# Patient Record
Sex: Female | Born: 1950 | Race: White | Hispanic: No | Marital: Married | Smoking: Never smoker
Health system: Southern US, Community
[De-identification: ages and names within clinical notes are randomized; demographics above are authoritative.]

## PROBLEM LIST (undated history)

## (undated) DIAGNOSIS — K219 Gastro-esophageal reflux disease without esophagitis: Secondary | ICD-10-CM

## (undated) DIAGNOSIS — D126 Benign neoplasm of colon, unspecified: Secondary | ICD-10-CM

## (undated) DIAGNOSIS — K5790 Diverticulosis of intestine, part unspecified, without perforation or abscess without bleeding: Secondary | ICD-10-CM

## (undated) DIAGNOSIS — K648 Other hemorrhoids: Secondary | ICD-10-CM

## (undated) DIAGNOSIS — M1990 Unspecified osteoarthritis, unspecified site: Secondary | ICD-10-CM

## (undated) DIAGNOSIS — I10 Essential (primary) hypertension: Secondary | ICD-10-CM

## (undated) DIAGNOSIS — K635 Polyp of colon: Secondary | ICD-10-CM

## (undated) DIAGNOSIS — E785 Hyperlipidemia, unspecified: Secondary | ICD-10-CM

## (undated) DIAGNOSIS — E049 Nontoxic goiter, unspecified: Secondary | ICD-10-CM

## (undated) HISTORY — PX: TUBAL LIGATION: SHX77

## (undated) HISTORY — PX: KNEE ARTHROSCOPY: SUR90

## (undated) HISTORY — PX: COLONOSCOPY: SHX174

## (undated) HISTORY — PX: FOOT SURGERY: SHX648

## (undated) HISTORY — PX: CHOLECYSTECTOMY: SHX55

## (undated) HISTORY — PX: APPENDECTOMY: SHX54

---

## 1999-06-30 ENCOUNTER — Other Ambulatory Visit: Admission: RE | Admit: 1999-06-30 | Discharge: 1999-06-30 | Payer: Self-pay | Admitting: Family Medicine

## 1999-12-12 ENCOUNTER — Ambulatory Visit (HOSPITAL_COMMUNITY): Admission: RE | Admit: 1999-12-12 | Discharge: 1999-12-12 | Payer: Self-pay | Admitting: Orthopedic Surgery

## 2000-07-04 ENCOUNTER — Other Ambulatory Visit: Admission: RE | Admit: 2000-07-04 | Discharge: 2000-07-04 | Payer: Self-pay | Admitting: Family Medicine

## 2004-11-26 ENCOUNTER — Emergency Department: Payer: Self-pay | Admitting: Emergency Medicine

## 2008-03-15 ENCOUNTER — Ambulatory Visit: Payer: Self-pay | Admitting: Gastroenterology

## 2009-05-28 ENCOUNTER — Ambulatory Visit: Payer: Self-pay

## 2009-07-15 ENCOUNTER — Ambulatory Visit: Payer: Self-pay | Admitting: Orthopedic Surgery

## 2009-07-21 ENCOUNTER — Ambulatory Visit: Payer: Self-pay | Admitting: Orthopedic Surgery

## 2010-02-10 ENCOUNTER — Ambulatory Visit: Payer: Self-pay | Admitting: Family Medicine

## 2011-05-11 ENCOUNTER — Ambulatory Visit: Payer: Self-pay | Admitting: Gastroenterology

## 2014-06-28 ENCOUNTER — Ambulatory Visit
Admit: 2014-06-28 | Disposition: A | Discharge status: Home / Self Care | Payer: Self-pay | Attending: Gastroenterology | Admitting: Gastroenterology

## 2014-07-05 LAB — SURGICAL PATHOLOGY

## 2014-09-15 ENCOUNTER — Encounter
Admission: RE | Admit: 2014-09-15 | Discharge: 2014-09-15 | Disposition: A | Discharge status: Home / Self Care | Payer: 59 | Source: Ambulatory Visit | Attending: Unknown Physician Specialty | Admitting: Unknown Physician Specialty

## 2014-09-15 DIAGNOSIS — Z01810 Encounter for preprocedural cardiovascular examination: Secondary | ICD-10-CM | POA: Diagnosis present

## 2014-09-15 DIAGNOSIS — Z01812 Encounter for preprocedural laboratory examination: Secondary | ICD-10-CM | POA: Insufficient documentation

## 2014-09-15 HISTORY — DX: Diverticulosis of intestine, part unspecified, without perforation or abscess without bleeding: K57.90

## 2014-09-15 HISTORY — DX: Unspecified osteoarthritis, unspecified site: M19.90

## 2014-09-15 HISTORY — DX: Hyperlipidemia, unspecified: E78.5

## 2014-09-15 LAB — URINALYSIS COMPLETE WITH MICROSCOPIC (ARMC ONLY)
Bacteria, UA: NONE SEEN
Bilirubin Urine: NEGATIVE
Glucose, UA: NEGATIVE mg/dL
Ketones, ur: NEGATIVE mg/dL
Nitrite: NEGATIVE
PROTEIN: NEGATIVE mg/dL
Specific Gravity, Urine: 1.025 (ref 1.005–1.030)
pH: 5 (ref 5.0–8.0)

## 2014-09-15 LAB — BASIC METABOLIC PANEL
Anion gap: 8 (ref 5–15)
BUN: 18 mg/dL (ref 6–20)
CALCIUM: 8.9 mg/dL (ref 8.9–10.3)
CO2: 28 mmol/L (ref 22–32)
CREATININE: 0.84 mg/dL (ref 0.44–1.00)
Chloride: 101 mmol/L (ref 101–111)
GFR calc Af Amer: >60 mL/min (ref >60)
Glucose, Bld: 102 mg/dL — ABNORMAL HIGH (ref 65–99)
POTASSIUM: 4.1 mmol/L (ref 3.5–5.1)
SODIUM: 137 mmol/L (ref 135–145)

## 2014-09-15 LAB — CBC
HEMATOCRIT: 43.7 % (ref 35.0–47.0)
HEMOGLOBIN: 14.5 g/dL (ref 12.0–16.0)
MCH: 28.8 pg (ref 26.0–34.0)
MCHC: 33.1 g/dL (ref 32.0–36.0)
MCV: 86.9 fL (ref 80.0–100.0)
Platelets: 221 10*3/uL (ref 150–440)
RBC: 5.03 MIL/uL (ref 3.80–5.20)
RDW: 13.8 % (ref 11.5–14.5)
WBC: 9 10*3/uL (ref 3.6–11.0)

## 2014-09-15 LAB — PROTIME-INR
INR: 0.97
PROTHROMBIN TIME: 13.1 s (ref 11.4–15.0)

## 2014-09-15 LAB — SURGICAL PCR SCREEN
MRSA, PCR: NEGATIVE
Staphylococcus aureus: POSITIVE — AB

## 2014-09-15 LAB — APTT: aPTT: 31 seconds (ref 24–36)

## 2014-09-15 LAB — ABO/RH: ABO/RH(D): A POS

## 2014-09-15 NOTE — Patient Instructions (Signed)
  Your procedure is scheduled on: September 29, 2014 (Wednesday) Report to Day Surgery. To find out your arrival time please call [phone] between 1PM - 3PM on September 28, 2014 (Tuesday).  Remember: Instructions that are not followed completely may result in serious medical risk, up to and including death, or upon the discretion of your surgeon and anesthesiologist your surgery may need to be rescheduled.    __x__ 1. Do not eat food or drink liquids after midnight. No gum chewing or hard candies.     ____ 2. No Alcohol for 24 hours before or after surgery.   ____ 3. Bring all medications with you on the day of surgery if instructed.    __x__ 4. Notify your doctor if there is any change in your medical condition     (cold, fever, infections).     Do not wear jewelry, make-up, hairpins, clips or nail polish.  Do not wear lotions, powders, or perfumes. You may wear deodorant.  Do not shave 48 hours prior to surgery. Men may shave face and neck.  Do not bring valuables to the hospital.    [facility] is not responsible for any belongings or valuables.               Contacts, dentures or bridgework may not be worn into surgery.  Leave your suitcase in the car. After surgery it may be brought to your room.  For patients admitted to the hospital, discharge time is determined by your                treatment team.   Patients discharged the day of surgery will not be allowed to drive home.   Please read over the following fact sheets that you were given:   MRSA Information and Surgical Site Infection Prevention   ____ Take these medicines the morning of surgery with A SIP OF WATER:    1.   2.   3.   4.  5.  6.  ____ Fleet Enema (as directed)   _x_ Use CHG Soap as directed  ____ Use inhalers on the day of surgery  ____ Stop metformin 2 days prior to surgery    ____ Take 1/2 of usual insulin dose the night before surgery and none on the morning of surgery.   ____ Stop  Coumadin/Plavix/aspirin on   __x__ Stop Anti-inflammatories on (STOP MELOXICAM ONE WEEK BEFORE SURGERY)  __x__ Stop supplements until after surgery.  (STOP GLUCOSAMINE NOW)  ____ Bring C-Pap to the hospital.

## 2014-09-29 ENCOUNTER — Inpatient Hospital Stay: Payer: 59

## 2014-09-29 ENCOUNTER — Inpatient Hospital Stay: Payer: 59 | Admitting: Anesthesiology

## 2014-09-29 ENCOUNTER — Encounter
Admission: RE | Disposition: A | Discharge status: Skilled Nursing Facility | Payer: Self-pay | Source: Ambulatory Visit | Attending: Unknown Physician Specialty

## 2014-09-29 ENCOUNTER — Other Ambulatory Visit: Payer: Self-pay | Admitting: Unknown Physician Specialty

## 2014-09-29 ENCOUNTER — Encounter: Payer: Self-pay | Admitting: *Deleted

## 2014-09-29 ENCOUNTER — Inpatient Hospital Stay
Admission: RE | Admit: 2014-09-29 | Discharge: 2014-10-02 | DRG: 470 | Disposition: A | Discharge status: Skilled Nursing Facility | Payer: 59 | Source: Ambulatory Visit | Attending: Unknown Physician Specialty | Admitting: Unknown Physician Specialty

## 2014-09-29 DIAGNOSIS — Z9889 Other specified postprocedural states: Secondary | ICD-10-CM

## 2014-09-29 DIAGNOSIS — E785 Hyperlipidemia, unspecified: Secondary | ICD-10-CM | POA: Diagnosis present

## 2014-09-29 DIAGNOSIS — K5790 Diverticulosis of intestine, part unspecified, without perforation or abscess without bleeding: Secondary | ICD-10-CM | POA: Diagnosis present

## 2014-09-29 DIAGNOSIS — M179 Osteoarthritis of knee, unspecified: Secondary | ICD-10-CM | POA: Diagnosis present

## 2014-09-29 DIAGNOSIS — Z6841 Body Mass Index (BMI) 40.0 and over, adult: Secondary | ICD-10-CM | POA: Diagnosis not present

## 2014-09-29 DIAGNOSIS — Z96659 Presence of unspecified artificial knee joint: Secondary | ICD-10-CM

## 2014-09-29 HISTORY — PX: TOTAL KNEE ARTHROPLASTY: SHX125

## 2014-09-29 LAB — CBC
HCT: 41.8 % (ref 35.0–47.0)
HEMOGLOBIN: 13.8 g/dL (ref 12.0–16.0)
MCH: 28.8 pg (ref 26.0–34.0)
MCHC: 33.1 g/dL (ref 32.0–36.0)
MCV: 87.2 fL (ref 80.0–100.0)
Platelets: 216 10*3/uL (ref 150–440)
RBC: 4.79 MIL/uL (ref 3.80–5.20)
RDW: 13.8 % (ref 11.5–14.5)
WBC: 10.1 10*3/uL (ref 3.6–11.0)

## 2014-09-29 LAB — CREATININE, SERUM
Creatinine, Ser: 0.87 mg/dL (ref 0.44–1.00)
GFR calc Af Amer: >60 mL/min (ref >60)
GFR calc non Af Amer: >60 mL/min (ref >60)

## 2014-09-29 LAB — PREPARE RBC (CROSSMATCH)

## 2014-09-29 SURGERY — ARTHROPLASTY, KNEE, TOTAL
Anesthesia: Regional | Laterality: Left

## 2014-09-29 MED ORDER — CEFAZOLIN SODIUM-DEXTROSE 2-3 GM-% IV SOLR
2.0000 g | Freq: Once | INTRAVENOUS | Status: AC
  Administered 2014-09-29: 1 g via INTRAVENOUS
  Administered 2014-09-29: 2 g via INTRAVENOUS

## 2014-09-29 MED ORDER — DIPHENHYDRAMINE HCL 50 MG/ML IJ SOLN
INTRAMUSCULAR | Status: AC
  Administered 2014-09-29: 25 mg via INTRAVENOUS
  Filled 2014-09-29: qty 1

## 2014-09-29 MED ORDER — ACETAMINOPHEN 10 MG/ML IV SOLN
INTRAVENOUS | Status: AC
  Filled 2014-09-29: qty 100

## 2014-09-29 MED ORDER — CEFAZOLIN SODIUM-DEXTROSE 2-3 GM-% IV SOLR
INTRAVENOUS | Status: AC
  Filled 2014-09-29: qty 50

## 2014-09-29 MED ORDER — ONDANSETRON HCL 4 MG/2ML IJ SOLN
4.0000 mg | Freq: Four times a day (QID) | INTRAMUSCULAR | Status: DC | PRN
  Administered 2014-09-30: 4 mg via INTRAVENOUS
  Filled 2014-09-29: qty 2

## 2014-09-29 MED ORDER — DIPHENHYDRAMINE HCL 50 MG/ML IJ SOLN
25.0000 mg | Freq: Once | INTRAMUSCULAR | Status: AC
  Administered 2014-09-29: 25 mg via INTRAVENOUS

## 2014-09-29 MED ORDER — BUPIVACAINE-EPINEPHRINE (PF) 0.5% -1:200000 IJ SOLN
INTRAMUSCULAR | Status: AC
  Filled 2014-09-29: qty 30

## 2014-09-29 MED ORDER — LIDOCAINE HCL (CARDIAC) 20 MG/ML IV SOLN
INTRAVENOUS | Status: DC | PRN
  Administered 2014-09-29: 80 mg via INTRAVENOUS

## 2014-09-29 MED ORDER — NEOMYCIN-POLYMYXIN B GU 40-200000 IR SOLN
Status: AC
  Filled 2014-09-29: qty 20

## 2014-09-29 MED ORDER — PRAVASTATIN SODIUM 20 MG PO TABS
20.0000 mg | ORAL_TABLET | Freq: Every day | ORAL | Status: DC
  Administered 2014-09-30 – 2014-10-01 (×2): 20 mg via ORAL
  Filled 2014-09-29 (×2): qty 1

## 2014-09-29 MED ORDER — BUPIVACAINE HCL (PF) 0.5 % IJ SOLN
INTRAMUSCULAR | Status: DC | PRN
  Administered 2014-09-29: 15 mg

## 2014-09-29 MED ORDER — EPHEDRINE SULFATE 50 MG/ML IJ SOLN
INTRAMUSCULAR | Status: DC | PRN
  Administered 2014-09-29 (×2): 10 mg via INTRAVENOUS

## 2014-09-29 MED ORDER — BUPIVACAINE LIPOSOME 1.3 % IJ SUSP
INTRAMUSCULAR | Status: AC
  Filled 2014-09-29: qty 20

## 2014-09-29 MED ORDER — PHENOL 1.4 % MT LIQD: 1.0000 | OROMUCOSAL | Status: DC | PRN

## 2014-09-29 MED ORDER — DEXAMETHASONE SODIUM PHOSPHATE 10 MG/ML IJ SOLN
INTRAMUSCULAR | Status: AC
  Administered 2014-09-29: 10 mg via INTRAVENOUS
  Filled 2014-09-29: qty 1

## 2014-09-29 MED ORDER — CEFAZOLIN SODIUM 1-5 GM-% IV SOLN
1.0000 g | Freq: Three times a day (TID) | INTRAVENOUS | Status: AC
  Administered 2014-09-29 – 2014-09-30 (×3): 1 g via INTRAVENOUS
  Filled 2014-09-29 (×3): qty 50

## 2014-09-29 MED ORDER — KCL IN DEXTROSE-NACL 20-5-0.45 MEQ/L-%-% IV SOLN
INTRAVENOUS | Status: DC
  Administered 2014-09-29 – 2014-09-30 (×2): via INTRAVENOUS
  Filled 2014-09-29 (×8): qty 1000

## 2014-09-29 MED ORDER — FENTANYL CITRATE (PF) 100 MCG/2ML IJ SOLN: 25.0000 ug | INTRAMUSCULAR | Status: DC | PRN

## 2014-09-29 MED ORDER — TRANEXAMIC ACID 1000 MG/10ML IV SOLN
INTRAVENOUS | Status: AC
  Filled 2014-09-29: qty 10

## 2014-09-29 MED ORDER — OXYCODONE HCL 5 MG PO TABS
5.0000 mg | ORAL_TABLET | ORAL | Status: DC | PRN
  Administered 2014-09-29 (×3): 5 mg via ORAL
  Administered 2014-09-30 (×4): 10 mg via ORAL
  Administered 2014-09-30 – 2014-10-01 (×3): 5 mg via ORAL
  Administered 2014-10-01: 10 mg via ORAL
  Filled 2014-09-29: qty 1
  Filled 2014-09-29 (×2): qty 2
  Filled 2014-09-29: qty 1
  Filled 2014-09-29 (×2): qty 2
  Filled 2014-09-29: qty 1
  Filled 2014-09-29: qty 2
  Filled 2014-09-29 (×2): qty 1

## 2014-09-29 MED ORDER — FAMOTIDINE 20 MG PO TABS
20.0000 mg | ORAL_TABLET | Freq: Once | ORAL | Status: AC
  Administered 2014-09-29: 20 mg via ORAL

## 2014-09-29 MED ORDER — DEXAMETHASONE SODIUM PHOSPHATE 10 MG/ML IJ SOLN
10.0000 mg | Freq: Once | INTRAMUSCULAR | Status: AC
  Administered 2014-09-29: 10 mg via INTRAVENOUS

## 2014-09-29 MED ORDER — SODIUM CHLORIDE 0.9 % IV SOLN
INTRAVENOUS | Status: DC | PRN
  Administered 2014-09-29: 60 mL

## 2014-09-29 MED ORDER — ACETAMINOPHEN 325 MG PO TABS: 650.0000 mg | ORAL_TABLET | Freq: Four times a day (QID) | ORAL | Status: DC | PRN

## 2014-09-29 MED ORDER — HYDROMORPHONE HCL 1 MG/ML IJ SOLN: 0.2500 mg | INTRAMUSCULAR | Status: DC | PRN

## 2014-09-29 MED ORDER — TRANEXAMIC ACID 1000 MG/10ML IV SOLN
1000.0000 mg | INTRAVENOUS | Status: DC | PRN
  Administered 2014-09-29: 1000 mg via INTRAVENOUS

## 2014-09-29 MED ORDER — LACTATED RINGERS IV SOLN
INTRAVENOUS | Status: DC
  Administered 2014-09-29 (×2): via INTRAVENOUS

## 2014-09-29 MED ORDER — ENOXAPARIN SODIUM 30 MG/0.3ML ~~LOC~~ SOLN
30.0000 mg | Freq: Two times a day (BID) | SUBCUTANEOUS | Status: DC
  Administered 2014-09-29 – 2014-10-02 (×6): 30 mg via SUBCUTANEOUS
  Filled 2014-09-29 (×6): qty 0.3

## 2014-09-29 MED ORDER — SODIUM CHLORIDE 0.9 % IJ SOLN
INTRAMUSCULAR | Status: AC
  Filled 2014-09-29: qty 50

## 2014-09-29 MED ORDER — MIDAZOLAM HCL 2 MG/2ML IJ SOLN
INTRAMUSCULAR | Status: DC | PRN
  Administered 2014-09-29: 2 mg via INTRAVENOUS

## 2014-09-29 MED ORDER — ONDANSETRON HCL 4 MG/2ML IJ SOLN: 4.0000 mg | Freq: Once | INTRAMUSCULAR | Status: DC | PRN

## 2014-09-29 MED ORDER — BUPIVACAINE-EPINEPHRINE (PF) 0.5% -1:200000 IJ SOLN
INTRAMUSCULAR | Status: DC | PRN
Start: 2014-09-29 — End: 2014-09-29
  Administered 2014-09-29: 30 mL

## 2014-09-29 MED ORDER — VANCOMYCIN HCL IN DEXTROSE 1-5 GM/200ML-% IV SOLN
1000.0000 mg | Freq: Once | INTRAVENOUS | Status: AC
  Administered 2014-09-29: 1000 mg via INTRAVENOUS

## 2014-09-29 MED ORDER — PROPOFOL INFUSION 10 MG/ML OPTIME
INTRAVENOUS | Status: DC | PRN
  Administered 2014-09-29: 75 ug/kg/min via INTRAVENOUS

## 2014-09-29 MED ORDER — VANCOMYCIN HCL IN DEXTROSE 1-5 GM/200ML-% IV SOLN
INTRAVENOUS | Status: AC
  Administered 2014-09-29: 1000 mg via INTRAVENOUS
  Filled 2014-09-29: qty 200

## 2014-09-29 MED ORDER — NEOMYCIN-POLYMYXIN B GU 40-200000 IR SOLN
Status: DC | PRN
Start: 2014-09-29 — End: 2014-09-29
  Administered 2014-09-29: 16 mL

## 2014-09-29 MED ORDER — ONDANSETRON HCL 4 MG PO TABS: 4.0000 mg | ORAL_TABLET | Freq: Four times a day (QID) | ORAL | Status: DC | PRN

## 2014-09-29 MED ORDER — HYDROMORPHONE HCL 1 MG/ML IJ SOLN
1.0000 mg | INTRAMUSCULAR | Status: DC | PRN
  Administered 2014-09-29 – 2014-10-01 (×6): 1 mg via INTRAVENOUS
  Filled 2014-09-29 (×6): qty 1

## 2014-09-29 MED ORDER — ACETAMINOPHEN 650 MG RE SUPP: 650.0000 mg | Freq: Four times a day (QID) | RECTAL | Status: DC | PRN

## 2014-09-29 MED ORDER — FAMOTIDINE 20 MG PO TABS
ORAL_TABLET | ORAL | Status: AC
  Administered 2014-09-29: 20 mg via ORAL
  Filled 2014-09-29: qty 1

## 2014-09-29 MED ORDER — POLYETHYLENE GLYCOL 3350 17 G PO PACK
17.0000 g | PACK | Freq: Every day | ORAL | Status: DC | PRN
  Administered 2014-10-01: 17 g via ORAL
  Filled 2014-09-29: qty 1

## 2014-09-29 MED ORDER — MENTHOL 3 MG MT LOZG: 1.0000 | LOZENGE | OROMUCOSAL | Status: DC | PRN

## 2014-09-29 MED ORDER — ACETAMINOPHEN 10 MG/ML IV SOLN
INTRAVENOUS | Status: DC | PRN
  Administered 2014-09-29: 1000 mg via INTRAVENOUS

## 2014-09-29 SURGICAL SUPPLY — 61 items
BLADE SAGITTAL AGGR TOOTH XLG (BLADE) ×3 IMPLANT
BLADE SAW 1/2 (BLADE) ×3 IMPLANT
BLADE SAW SAG 29X58X.64 (BLADE) ×3 IMPLANT
BLADE SURG 15 STRL LF DISP TIS (BLADE) ×1 IMPLANT
BLADE SURG 15 STRL SS (BLADE) ×3
BNDG COHESIVE 6X5 TAN STRL LF (GAUZE/BANDAGES/DRESSINGS) ×3 IMPLANT
BOWL CEMENT MIXING ADV NOZZLE (MISCELLANEOUS) ×3 IMPLANT
CANISTER SUCT 1200ML W/VALVE (MISCELLANEOUS) ×3 IMPLANT
CANISTER SUCT 3000ML (MISCELLANEOUS) ×3 IMPLANT
CAPT KNEE TOTAL 3 ×2 IMPLANT
CATH TRAY 16F METER LATEX (MISCELLANEOUS) ×3 IMPLANT
CEMENT BONE GENTAMICIN (Cement) ×6 IMPLANT
CEMENT BONE GENTAMICIN PWDR (Cement) ×2 IMPLANT
CHLORAPREP W/TINT 26ML (MISCELLANEOUS) ×9 IMPLANT
COOLER POLAR GLACIER W/PUMP (MISCELLANEOUS) ×3 IMPLANT
DECANTER SPIKE VIAL GLASS SM (MISCELLANEOUS) ×6 IMPLANT
DRAPE INCISE IOBAN 66X45 STRL (DRAPES) ×3 IMPLANT
DRAPE SHEET LG 3/4 BI-LAMINATE (DRAPES) ×3 IMPLANT
DRSG AQUACEL AG ADV 3.5X10 (GAUZE/BANDAGES/DRESSINGS) ×2 IMPLANT
GAUZE PETRO XEROFOAM 1X8 (MISCELLANEOUS) ×3 IMPLANT
GAUZE SPONGE 4X4 12PLY STRL (GAUZE/BANDAGES/DRESSINGS) ×3 IMPLANT
GLOVE BIO SURGEON STRL SZ8 (GLOVE) ×12 IMPLANT
GLOVE BIOGEL M STRL SZ7.5 (GLOVE) ×3 IMPLANT
GLOVE INDICATOR 8.0 STRL GRN (GLOVE) ×3 IMPLANT
GOWN STRL REUS W/ TWL LRG LVL3 (GOWN DISPOSABLE) ×2 IMPLANT
GOWN STRL REUS W/TWL LRG LVL3 (GOWN DISPOSABLE) ×6
GOWN STRL REUS W/TWL LRG LVL4 (GOWN DISPOSABLE) ×6 IMPLANT
HANDPIECE SUCTION TUBG SURGILV (MISCELLANEOUS) ×3 IMPLANT
HOOD PEEL AWAY FACE SHEILD DIS (HOOD) ×6 IMPLANT
IMMBOLIZER KNEE 19 BLUE UNIV (SOFTGOODS) ×2 IMPLANT
IRRIGATION STRYKERFLOW (MISCELLANEOUS) ×1 IMPLANT
IRRIGATOR STRYKERFLOW (MISCELLANEOUS) ×3
KIT RM TURNOVER STRD PROC AR (KITS) ×3 IMPLANT
NDL SAFETY 18GX1.5 (NEEDLE) ×3 IMPLANT
NDL SAFETY 22GX1.5 (NEEDLE) ×3 IMPLANT
NDL SPNL 18GX3.5 QUINCKE PK (NEEDLE) ×1 IMPLANT
NDL SPNL 20GX3.5 QUINCKE YW (NEEDLE) ×2 IMPLANT
NEEDLE SPNL 18GX3.5 QUINCKE PK (NEEDLE) ×3 IMPLANT
NEEDLE SPNL 20GX3.5 QUINCKE YW (NEEDLE) ×6 IMPLANT
NS IRRIG 1000ML POUR BTL (IV SOLUTION) ×3 IMPLANT
PACK TOTAL KNEE (MISCELLANEOUS) ×3 IMPLANT
PAD ABD DERMACEA PRESS 5X9 (GAUZE/BANDAGES/DRESSINGS) ×3 IMPLANT
PAD GROUND ADULT SPLIT (MISCELLANEOUS) ×3 IMPLANT
PAD WRAPON POLAR KNEE (MISCELLANEOUS) ×1 IMPLANT
SOL .9 NS 3000ML IRR  AL (IV SOLUTION) ×2
SOL .9 NS 3000ML IRR AL (IV SOLUTION) ×1
SOL .9 NS 3000ML IRR UROMATIC (IV SOLUTION) ×1 IMPLANT
SOL PREP PVP 2OZ (MISCELLANEOUS) ×3
SOLUTION PREP PVP 2OZ (MISCELLANEOUS) ×1 IMPLANT
STAPLER SKIN PROX 35W (STAPLE) ×3 IMPLANT
SUCTION FRAZIER TIP 10 FR DISP (SUCTIONS) ×3 IMPLANT
SUT ETHIBOND CT1 BRD #0 30IN (SUTURE) ×10 IMPLANT
SUT ETHIBOND NAB CT1 #1 30IN (SUTURE) ×2 IMPLANT
SUT VIC AB 2-0 CT1 27 (SUTURE) ×6
SUT VIC AB 2-0 CT1 TAPERPNT 27 (SUTURE) ×2 IMPLANT
SYR 20CC LL (SYRINGE) ×3 IMPLANT
SYR 30ML LL (SYRINGE) ×3 IMPLANT
SYR 50ML LL SCALE MARK (SYRINGE) ×3 IMPLANT
SYRINGE 10CC LL (SYRINGE) ×3 IMPLANT
WATER STERILE IRR 1000ML POUR (IV SOLUTION) ×3 IMPLANT
WRAPON POLAR PAD KNEE (MISCELLANEOUS) ×3

## 2014-09-29 NOTE — H&P (Signed)
  H and P reviewed. No changes. Uploaded at later date. 

## 2014-09-29 NOTE — Anesthesia Procedure Notes (Signed)
Spinal Patient location during procedure: OR Staffing Performed by: resident/CRNA  Preanesthetic Checklist Completed: patient identified, site marked, surgical consent, pre-op evaluation, timeout performed, IV checked, risks and benefits discussed and monitors and equipment checked Spinal Block Patient position: sitting Prep: Betadine Patient monitoring: heart rate, continuous pulse ox, blood pressure and cardiac monitor Approach: midline Location: L4-5 Injection technique: single-shot Needle Needle type: Whitacre and Introducer  Needle gauge: 25 G Needle length: 9 cm Additional Notes Negative paresthesia. Negative blood return. Positive free-flowing CSF. Expiration date of kit checked and confirmed. Patient tolerated procedure well, without complications.

## 2014-09-29 NOTE — Op Note (Signed)
DATE OF SURGERY:  09/29/2014  PATIENT NAME:  Linda Rios   DOB: [birth year]  MRN: [id number]  PRE-OPERATIVE DIAGNOSIS: Degenerative arthrosis of the left knee  POST-OPERATIVE DIAGNOSIS:  Degenerative arthrosis of the left knee  PROCEDURE:  Left total knee arthroplasty  SURGEON:  Dr.Chery Giusto Leeanne Mannan. M.D.  ASSISTANT: Reche Dixon, PA-C     ANESTHESIA: Gen.  ESTIMATED BLOOD LOSS: 50 mL  TOURNIQUET TIME: 115 minutes   IMPLANTS UTILIZED: #4 triathlon posterior stabilized femoral component, a #5 tibial baseplate, a #59 mm thick tibial bearing insert, a 10 mm thick asymmetric patella that was 32 mm in diameter  INDICATIONS FOR SURGERY: Linda Rios is a 64 y.o. year old female with a long history of progressive knee pain. X-rays demonstrated severe degenerative changes in tricompartmental fashion. The patient had not seen any significant improvement despite conservative nonsurgical intervention. After discussion of the risks and benefits of surgical intervention, the patient expressed understanding of the risks benefits and agree with plans for total knee arthroplasty.   The risks, benefits, and alternatives were discussed at length including but not limited to the risks of infection, bleeding, nerve injury, stiffness, blood clots, the need for revision surgery, cardiopulmonary complications, among others, and they were willing to proceed.  PROCEDURE:   The patient was taken to the operating room where satisfactory general anesthesia was achieved. A Foley catheter was inserted. The patient was given 2 g of Kefzol IV prior to start of the procedure. A tourniquet was applied to left upper thigh. The left lower extremity was prepped and draped in the usual fashion for a total knee procedure. The left lower extremity was then exsanguinated, and the tourniquet was inflated. A medial parapatella incision was made.. Dissection carried down through the subcutaneous tissue onto the capsule. This was  divided in line with the incision. The knee was then inspected. There was significant erosion of the lateral tibial plateau and  femoral condylar surfaces. There was moderate trochlear groove erosion and moderate retropatellar erosion. I went ahead and drilled a hole in the intercondylar notch. Intramedullary rod was inserted. On the rod was the distal femoral cutting block. It was positioned against the distal femur. It was set for  5 degree valgus cut. The cutting block was fixed to the distal femur with smooth pins and then the intramedullary road was removed. The distal femoral cut was made, removing 10 mm of bone. I then went ahead and removed some medial and lateral compartment meniscal remnants. ACL and PCL were excised. The knee was hyperflexed. External alignment jig was placed on the left leg and set for a cut 2 mm below the lateral tibial plateau. The cut was sloped 3 degrees. I went ahead and made this cut without difficulty. I removed the cut bone from the proximal tibial which gave me a flush cut across the tibial surface. I then inserted a  gap spacer with the knee extended, and indeed it was too tight so I removed 2 more millimeters of bone from the proximal tibial surface. This allowed the trial spacer to be inserted without difficulty. The knee was hyperflexed. The femoral sizing component was placed on the distal femur. It was lined up with the epicondylar axis. It was felt that a #4 femoral component was going to be the appropriate size. Holes were made for this component through the sizing guide, and then the sizing guide was removed. The four-in-one cutting block was impacted onto the distal femur. Anterior and posterior cuts  were made along with the anterior and posterior chamfer cuts. I then notched out the intercondylar area with the appropriate jig. I then sized the proximal tibia for a #5 base plate. With the #4 femoral component in place and the #5 base plate in place, I was able to  insert a 9 mm trial tibial-bearing insert. With all the trials in place, the knee fully extended and flexed well.. I then  resected 8 mm of bone from retropatellar surface. I made peg holes for the A 32 mm patellar implant. Trial patella was inserted. It tracked well.   The trials were removed. The tibial base plate was positioned appropriately for the proximal tibial cruciate cut. The proximal tibial cruciate cut ws made, and then a small reamer was used to ream into the proximal tibia. Next, I went ahead and drilled 2 holes through the trial  component for the pegs on the permanent femoral component.    I then sequentially impacted the components. The #5  tibial base place was impacted on the proximal tibia with antibiotic-impregnated methyl methacrylate. Excess glue ws removed. I then impacted the #4 femoral component onto the distal femur with methyl methacrylate. Again, excess glue was removed. I then inserted a trial 9 mm spacer. The knee was extended.  I went ahead and applied a 10 mm thick asymmetric patella to the retropatellar surface with methyl methacrylate. Once again, excess glue was removed. After the glue had set up I went ahead and removed the  trial tibial spacer and inserted a permanent 9 mm tibial spacer.  The knee came to full extension and flexed well. The knee seemed to be reasonably stable.   Tourniquet was released at this time. Tourniquet was up for about 115 minutes.  Bleeding was controlled with coagulation cautery. The wound was irrigated with GU irrigant.  I also injected the capsule and subcutaneous tissue with about 50 cc of Exparel and saline mixture and 50 cc of .25% Marcaine. I went ahead and closed the capsule with #0 ethibond sutures and then injected 10 cc of TXA through the capsule into the joint. The subcutaneous tissue was closed with  2-0 Vicryl, and skin with skin staples. Subsequent to the closure, I did go ahead and apply a sterile dressing and 4 TENS pads. Polar  Care cooling pad was applied.  The patient was then transferred to a stretcher bed and taken to the recovery room in satisfactory condition. Blood loss was about 50 cc.        Dr.Rik Wadel Leeanne Mannan. M.D.

## 2014-09-29 NOTE — OR Nursing (Signed)
Patient with c/o itching to head and scalp with hives and redness noted to forehead; pt in nad; IV vancomycin was discontinued at this time.  orders received from dr Andree Elk; administered per protocol. Reaction noted about 30 minutes after vancomycin was started.  Patient reports relief several minutes after the vancomycin was removed. resp even and unl; redness continues at base of scalp.   Patient was taken into OR with dr Andree Elk and CRNA at bedside;

## 2014-09-29 NOTE — Anesthesia Preprocedure Evaluation (Signed)
Anesthesia Evaluation  Patient identified by MRN, date of birth, ID band Patient awake    Reviewed: Allergy & Precautions, H&P , NPO status , Patient's Chart, lab work & pertinent test results, reviewed documented beta blocker date and time   Airway Mallampati: II  TM Distance: >3 FB Neck ROM: full    Dental no notable dental hx. (+) Teeth Intact   Pulmonary neg pulmonary ROS,  breath sounds clear to auscultation  Pulmonary exam normal       Cardiovascular Exercise Tolerance: Good negative cardio ROS Normal cardiovascular examRhythm:regular Rate:Normal     Neuro/Psych negative neurological ROS  negative psych ROS   GI/Hepatic negative GI ROS, Neg liver ROS,   Endo/Other  negative endocrine ROSMorbid obesity  Renal/GU negative Renal ROS  negative genitourinary   Musculoskeletal   Abdominal   Peds  Hematology negative hematology ROS (+)   Anesthesia Other Findings   Reproductive/Obstetrics                             Anesthesia Physical Anesthesia Plan  ASA: II  Anesthesia Plan: Regional and Epidural   Post-op Pain Management:    Induction:   Airway Management Planned:   Additional Equipment:   Intra-op Plan:   Post-operative Plan:   Informed Consent: I have reviewed the patients History and Physical, chart, labs and discussed the procedure including the risks, benefits and alternatives for the proposed anesthesia with the patient or authorized representative who has indicated his/her understanding and acceptance.     Plan Discussed with: CRNA  Anesthesia Plan Comments:         Anesthesia Quick Evaluation

## 2014-09-29 NOTE — Transfer of Care (Signed)
Immediate Anesthesia Transfer of Care Note  Patient: Linda Rios  Procedure(s) Performed: Procedure(s): TOTAL KNEE ARTHROPLASTY (Left)  Patient Location: PACU  Anesthesia Type:Spinal  Level of Consciousness: awake and patient cooperative  Airway & Oxygen Therapy: Patient Spontanous Breathing and Patient connected to face mask oxygen  Post-op Assessment: Report given to RN and Post -op Vital signs reviewed and stable  Post vital signs: Reviewed and stable  Last Vitals:  Filed Vitals:   09/29/14 1122  BP: 100/57  Pulse: 66  Temp: 36.4 C  Resp: 20    Complications: No apparent anesthesia complications

## 2014-09-30 ENCOUNTER — Encounter
Admission: RE | Admit: 2014-09-30 | Discharge: 2014-09-30 | Disposition: A | Discharge status: Home / Self Care | Payer: 59 | Source: Ambulatory Visit | Attending: Internal Medicine | Admitting: Internal Medicine

## 2014-09-30 ENCOUNTER — Encounter: Payer: Self-pay | Admitting: Unknown Physician Specialty

## 2014-09-30 LAB — BASIC METABOLIC PANEL
ANION GAP: 7 (ref 5–15)
BUN: 15 mg/dL (ref 6–20)
CALCIUM: 8.5 mg/dL — AB (ref 8.9–10.3)
CHLORIDE: 97 mmol/L — AB (ref 101–111)
CO2: 30 mmol/L (ref 22–32)
CREATININE: 0.95 mg/dL (ref 0.44–1.00)
Glucose, Bld: 123 mg/dL — ABNORMAL HIGH (ref 65–99)
Potassium: 4.3 mmol/L (ref 3.5–5.1)
Sodium: 134 mmol/L — ABNORMAL LOW (ref 135–145)

## 2014-09-30 MED ORDER — TRAMADOL HCL 50 MG PO TABS
50.0000 mg | ORAL_TABLET | Freq: Four times a day (QID) | ORAL | Status: DC | PRN
  Administered 2014-09-30 (×2): 50 mg via ORAL
  Administered 2014-10-01: 100 mg via ORAL
  Administered 2014-10-02: 50 mg via ORAL
  Filled 2014-09-30: qty 1
  Filled 2014-09-30: qty 2
  Filled 2014-09-30 (×2): qty 1

## 2014-09-30 NOTE — Evaluation (Signed)
Physical Therapy Evaluation Patient Details Name: Linda Rios MRN: [id number] DOB: 05-18-1950 Today's Date: 09/30/2014   History of Present Illness  This patient is a 64 year old female who came to [facility] for a left TKR  Clinical Impression  Pt presents with history of arthritis, hyperlipidemia, and diverticulosis. Examination reveals that pt ambulates with min assist +2, transfers with max assist +2, and performs bed mobility at moderate assist for LLE management. She was in a great deal of pain with mobility, but was willing to work with therapy in order to get to her recliner. Primary physical deficits include decreased strength, ROM, and decreased activity tolerance secondary to pain. Pt stated that the TENS unit on LLE made it feel somewhat better. She will continue to benefit from skilled PT in order to address above deficits for safe return to home environment.     Follow Up Recommendations Home health PT    Equipment Recommendations  None recommended by PT    Recommendations for Other Services       Precautions / Restrictions Precautions Precautions: Fall Required Braces or Orthoses: Knee Immobilizer - Left (with mobility ) Knee Immobilizer - Left: On when out of bed or walking Restrictions Weight Bearing Restrictions: Yes (WBAT)      Mobility  Bed Mobility Overal bed mobility: Needs Assistance Bed Mobility: Supine to Sit     Supine to sit: Mod assist     General bed mobility comments: Pt performs bed mobility requiring mod assist for LLE managment and trunk support. She is able to use bed rails to manage her torso   Transfers Overall transfer level: Needs assistance Equipment used: Rolling walker (2 wheeled) Transfers: Sit to/from Stand Sit to Stand: Max assist;+2 physical assistance         General transfer comment: Pt requires assist for body weight support with standing. She also requires vc to proper left leg outward during transfers, as well as hand  placement prior to transfers.   Ambulation/Gait Ambulation/Gait assistance: Min assist;+2 physical assistance Ambulation Distance (Feet): 4 Feet Assistive device: Rolling walker (2 wheeled) Gait Pattern/deviations: Step-to pattern;Decreased step length - right;Decreased step length - left;Shuffle Gait velocity: decreased   General Gait Details: Pt able to ambulate to recliner, requiring assist for step initiation on left leg as well as guiding RW. She requires VC for sequencing of RW with gait  Stairs            Wheelchair Mobility    Modified Rankin (Stroke Patients Only)       Balance Overall balance assessment: No apparent balance deficits (not formally assessed)                                           Pertinent Vitals/Pain Pain Assessment: 0-10 Pain Score: 7  Pain Location: L knee Pain Intervention(s): Limited activity within patient's tolerance;Monitored during session;Patient requesting pain meds-RN notified;Relaxation;Ice applied    Home Living Family/patient expects to be discharged to:: Private residence Living Arrangements: Spouse/significant other Available Help at Discharge: Family;Available 24 hours/day Type of Home: House Home Access: Stairs to enter Entrance Stairs-Rails: Can reach both Entrance Stairs-Number of Steps: 4 Home Layout: One level Home Equipment: Walker - 2 wheels Additional Comments: Husband is able to provide 24 hr care at home    Prior Function Level of Independence: Independent         Comments: Pt  was able to ambulate mostly around the home and garden surrounding her home.      Hand Dominance        Extremity/Trunk Assessment   Upper Extremity Assessment:  (B UE 5/5)           Lower Extremity Assessment: Defer to PT evaluation   LLE Deficits / Details: Strength and ROM deficits      Communication   Communication: No difficulties  Cognition Arousal/Alertness: Awake/alert Behavior During  Therapy: WFL for tasks assessed/performed Overall Cognitive Status: Within Functional Limits for tasks assessed                      General Comments      Exercises Total Joint Exercises Goniometric ROM: L knee AAROM: 6 - 61 degrees  Other Exercises Other Exercises: Pt performed ther-ex on bilateral LE x10 reps with min assist for facilation of movement (especially SLR and hip abd). Pt was not able to perform 10 reps of SLR on LLE successfully. Due to this, knee immobilzer was donned during mobility. All exercises included: ankle pumps, quad sets, glute sets, SLR, hip abd, and heel slides in sitting.       Assessment/Plan    PT Assessment Patient needs continued PT services  PT Diagnosis Difficulty walking;Abnormality of gait;Generalized weakness;Acute pain   PT Problem List Decreased strength;Decreased range of motion;Decreased activity tolerance;Decreased balance;Decreased mobility;Decreased cognition;Decreased knowledge of use of DME;Decreased safety awareness;Decreased knowledge of precautions;Pain  PT Treatment Interventions DME instruction;Gait training;Stair training;Functional mobility training;Therapeutic activities;Therapeutic exercise;Balance training;Neuromuscular re-education;Patient/family education   PT Goals (Current goals can be found in the Care Plan section) Acute Rehab PT Goals Patient Stated Goal: For the pain to stop PT Goal Formulation: With patient Time For Goal Achievement: 10/14/14 Potential to Achieve Goals: Good    Frequency BID   Barriers to discharge        Co-evaluation               End of Session Equipment Utilized During Treatment: Gait belt;Left knee immobilizer Activity Tolerance: Patient limited by pain;Patient tolerated treatment well Patient left: in chair;with call bell/phone within reach;with chair alarm set;with nursing/sitter in room;with SCD's reapplied Nurse Communication: Mobility status;Patient requests pain meds          Time: 8016-5537 PT Time Calculation (min) (ACUTE ONLY): 48 min   Charges:         PT G CodesJanyth Contes 10-06-2014, 12:00 PM  Janyth Contes, SPT. [phone]

## 2014-09-30 NOTE — Care Management Note (Addendum)
Case Management Note  Patient Details  Name: Linda Rios MRN: [id number] Date of Birth: 1950-08-28  Subjective/Objective:                   Met with patient to discuss discharge planning. She wants to go home with her husband. She states she has a rolling walker at home she can use. She uses [facility] [phone] for Rx. She is concerned about potential cost of Lovenox and states she can't afford it if it's over 20$. She would like to use [facility] for home PT. MD please provide face-to-face along with home health order for physical therapy.  Action/Plan:  List provided to patient of home health care agencies and she picked [facility]. [facility] RNCM to follow up with patient regarding cost.   Expected Discharge Date:  10/03/14               Expected Discharge Plan:     In-House Referral:     Discharge planning Services  CM Consult  Post Acute Care Choice:    Choice offered to:  Patient  DME Arranged:    DME Agency:  NA  HH Arranged:  PT HH Agency:  [facility] Status of Service:  In process, will continue to follow  Medicare Important Message Given:    Date Medicare IM Given:    Medicare IM give by:    Date Additional Medicare IM Given:    Additional Medicare Important Message give by:     If discussed at [facility] [place] Stay Meetings, dates discussed:    Additional Comments: Lovenox cancelled cost $40 and patient states she cannot afford that. PT is recommending SNF at this time. [facility] updated.   AMarshell Garfinkel RN 09/30/2014, 10:02 AM

## 2014-09-30 NOTE — Progress Notes (Signed)
Paged Lance(PA), callback received, informed of patient's pain control not well today with oxycodone 10 mg, new orders received to start patient on tramadol 50-100 mg q6hrs prn for pain.

## 2014-09-30 NOTE — Evaluation (Signed)
Physical Therapy Evaluation Patient Details Name: Linda Rios MRN: [id number] DOB: May 08, 1950 Today's Date: 09/30/2014   History of Present Illness  This patient is a 64 year old female who came to [facility] for a left TKR  Clinical Impression  Pt presents with history of arthritis, hyperlipidemia, and diverticulosis. Examination reveals that pt ambulates with min assist +2, transfers with max assist +2, and performs bed mobility at moderate assist for LLE management. She was in a great deal of pain with mobility, but was willing to work with therapy in order to get to her recliner. Primary physical deficits include decreased strength, ROM, and decreased activity tolerance secondary to pain. Pt stated that the TENS unit on LLE made it feel somewhat better. She will continue to benefit from skilled PT in order to address above deficits for safe return to home environment.   Follow Up Recommendations SNF    Equipment Recommendations  None recommended by PT    Recommendations for Other Services       Precautions / Restrictions Precautions Precautions: Fall Required Braces or Orthoses: Knee Immobilizer - Left Knee Immobilizer - Left: On when out of bed or walking Restrictions Weight Bearing Restrictions: Yes (WBAT)      Mobility  Bed Mobility Overal bed mobility: Needs Assistance Bed Mobility: Sit to Supine     Supine to sit: Mod assist Sit to supine: Mod assist   General bed mobility comments: Pt requires mod assist for trunk support in lowering as well as management of LEs   Transfers Overall transfer level: Needs assistance Equipment used: Rolling walker (2 wheeled) Transfers: Sit to/from Stand Sit to Stand: Max assist;+2 physical assistance         General transfer comment: Pt requires assist for body weight support with standing. She also requires vc to proper left leg outward during transfers, as well as hand placement prior to transfers.    Ambulation/Gait Ambulation/Gait assistance: Min assist Ambulation Distance (Feet): 4 Feet Assistive device: Rolling walker (2 wheeled) Gait Pattern/deviations: Decreased step length - right;Decreased step length - left;Shuffle;Step-to pattern Gait velocity: decreased   General Gait Details: Pt able to ambulate to bed, requiring assist for step initiation on left leg as well as guiding RW. She requires VC for sequencing of RW with gait. She also has a tendency to externally rotate and abduct the LLE, needs assist to draw foot in.   Stairs            Wheelchair Mobility    Modified Rankin (Stroke Patients Only)       Balance Overall balance assessment: No apparent balance deficits (not formally assessed)                                           Pertinent Vitals/Pain Pain Assessment: 0-10 Pain Score: 5  Pain Location: L knee Pain Intervention(s): Limited activity within patient's tolerance;Monitored during session;Ice applied;Utilized relaxation techniques    Home Living Family/patient expects to be discharged to:: Private residence Living Arrangements: Spouse/significant other Available Help at Discharge: Family;Available 24 hours/day Type of Home: House Home Access: Stairs to enter Entrance Stairs-Rails: Can reach both Entrance Stairs-Number of Steps: 4 Home Layout: One level Home Equipment: Walker - 2 wheels Additional Comments: Husband is able to provide 24 hr care at home    Prior Function Level of Independence: Independent  Comments: Pt was able to ambulate mostly around the home and garden surrounding her home.      Hand Dominance        Extremity/Trunk Assessment   Upper Extremity Assessment:  (B UE 5/5)           Lower Extremity Assessment: Defer to PT evaluation   LLE Deficits / Details: Strength and ROM deficits      Communication   Communication: No difficulties  Cognition Arousal/Alertness:  Awake/alert Behavior During Therapy: WFL for tasks assessed/performed Overall Cognitive Status: Within Functional Limits for tasks assessed                      General Comments General comments (skin integrity, edema, etc.): Pt practiced proper bed mobility and was assisted with KI setup and education, as well as TENS unit setup and education x10 minutes.     Exercises Total Joint Exercises Goniometric ROM: L knee AAROM: 6 - 61 degrees  Other Exercises Other Exercises: Pt performed ther-ex on bilateral LE x10 reps with min assist for facilation of movement (especially SLR and hip abd). Pt was not able to perform 10 reps of SLR on LLE successfully. Due to this, knee immobilzer was donned during mobility. All exercises included: ankle pumps, quad sets, glute sets, SLR, hip abd, and heel slides.       Assessment/Plan    PT Assessment Patient needs continued PT services  PT Diagnosis Difficulty walking;Abnormality of gait;Generalized weakness;Acute pain   PT Problem List Decreased strength;Decreased range of motion;Decreased activity tolerance;Decreased balance;Decreased mobility;Decreased cognition;Decreased knowledge of use of DME;Decreased safety awareness;Decreased knowledge of precautions;Pain  PT Treatment Interventions DME instruction;Gait training;Stair training;Functional mobility training;Therapeutic activities;Therapeutic exercise;Balance training;Neuromuscular re-education;Patient/family education   PT Goals (Current goals can be found in the Care Plan section) Acute Rehab PT Goals Patient Stated Goal: To return to bed PT Goal Formulation: With patient Time For Goal Achievement: 10/14/14 Potential to Achieve Goals: Good    Frequency BID   Barriers to discharge        Co-evaluation               End of Session Equipment Utilized During Treatment: Gait belt;Left knee immobilizer Activity Tolerance: Patient limited by pain;Patient tolerated treatment well  (No noted nausea following mobility) Patient left: in bed;with call bell/phone within reach;with chair alarm set;with family/visitor present;with SCD's reapplied Nurse Communication: Mobility status         Time: 4680-3212 PT Time Calculation (min) (ACUTE ONLY): 28 min   Charges:   PT Evaluation $Initial PT Evaluation Tier I: 1 Procedure PT Treatments $Gait Training: 8-22 mins $Therapeutic Exercise: 8-22 mins $Therapeutic Activity: 8-22 mins   PT G CodesJanyth Contes Oct 26, 2014, 3:17 PM  Janyth Contes, SPT. [phone]

## 2014-09-30 NOTE — Clinical Social Work Placement (Signed)
   CLINICAL SOCIAL WORK PLACEMENT  NOTE  Date:  09/30/2014  Patient Details  Name: Linda Rios MRN: [id number] Date of Birth: [birth year]  Clinical Social Work is seeking post-discharge placement for this patient at the [place] level of care (*CSW will initial, date and re-position this form in  chart as items are completed):  Yes   Patient/family provided with [place] Work Department's list of facilities offering this level of care within the geographic area requested by the patient (or if unable, by the patient's family).  Yes   Patient/family informed of their freedom to choose among providers that offer the needed level of care, that participate in Medicare, Medicaid or managed care program needed by the patient, have an available bed and are willing to accept the patient.  Yes   Patient/family informed of 's ownership interest in [facility] and [facility], as well as of the fact that they are under no obligation to receive care at these facilities.  PASRR submitted to EDS on 09/30/14     PASRR number received on 09/30/14     Existing PASRR number confirmed on       FL2 transmitted to all facilities in geographic area requested by pt/family on 09/30/14     FL2 transmitted to all facilities within larger geographic area on       Patient informed that his/her managed care company has contracts with or will negotiate with certain facilities, including the following:            Patient/family informed of bed offers received.  Patient chooses bed at       Physician recommends and patient chooses bed at      Patient to be transferred to   on  .  Patient to be transferred to facility by       Patient family notified on   of transfer.  Name of family member notified:        PHYSICIAN       Additional Comment:    _______________________________________________ Loralyn Freshwater, LCSW 09/30/2014, 4:00 PM

## 2014-09-30 NOTE — Progress Notes (Signed)
  Subjective: 1 Day Post-Op Procedure(s) (LRB): TOTAL KNEE ARTHROPLASTY (Left) Patient reports pain as mild.   Patient is well, but has had some minor complaints of pain Plan is to go home with home health after hospital stay. Negative for chest pain and shortness of breath Fever: no Gastrointestinal:Negative for nausea and vomiting  Objective: Vital signs in last 24 hours: Temp:  [96.6 F (35.9 C)-98.5 F (36.9 C)] 98.1 F (36.7 C) (07/21 0335) Pulse Rate:  [31-73] 71 (07/21 0335) Resp:  [9-20] 18 (07/21 0335) BP: (100-139)/(56-82) 113/82 mmHg (07/21 0335) SpO2:  [96 %-100 %] 98 % (07/21 0335)  Intake/Output from previous day:  Intake/Output Summary (Last 24 hours) at 09/30/14 0716 Last data filed at 09/30/14 1027  Gross per 24 hour  Intake   1660 ml  Output   2850 ml  Net  -1190 ml    Intake/Output this shift:    Labs:  Recent Labs  09/29/14 1353  HGB 13.8    Recent Labs  09/29/14 1353  WBC 10.1  RBC 4.79  HCT 41.8  PLT 216    Recent Labs  09/29/14 1353  CREATININE 0.87   No results for input(s): LABPT, INR in the last 72 hours.   EXAM General - Patient is Alert, Appropriate and Oriented Extremity - Neurologically intact ABD soft Neurovascular intact Sensation intact distally Dorsiflexion/Plantar flexion intact Incision: dressing C/D/I Dressing/Incision - clean, dry, no drainage Motor Function - intact, moving foot and toes well on exam.  Abdomen is soft on exam, no distention or tympany.  Pt has no complaints of chest pain or SOB.  Past Medical History  Diagnosis Date  . Arthritis   . Hyperlipidemia   . Diverticulosis     Assessment/Plan: 1 Day Post-Op Procedure(s) (LRB): TOTAL KNEE ARTHROPLASTY (Left) Active Problems:   S/P total knee replacement  Estimated body mass index is 41.99 kg/(m^2) as calculated from the following:   Height as of this encounter: 5\' 6"  (1.676 m).   Weight as of this encounter: 117.935 kg (260  lb). Advance diet Up with therapy D/C IV fluids   Foley removed POD1 Labs reviewed. CBC and BMP ordered for tomorrow morning. Order placed to d/c IV fluids when tolerating PO intake. PT will demonstrated how to attach TENS unit. Pt has not had a BM. Will change dressing on POD2  DVT Prophylaxis - Lovenox, Foot Pumps and TED hose Weight-Bearing as tolerated to left leg  J. Cameron Proud, PA-C [facility] 09/30/2014, 7:16 AM

## 2014-09-30 NOTE — Anesthesia Postprocedure Evaluation (Signed)
  Anesthesia Post-op Note  Patient: Linda Rios  Procedure(s) Performed: Procedure(s): TOTAL KNEE ARTHROPLASTY (Left)  Anesthesia type:Spinal, Regional  Patient location:148  Post pain: Pain level controlled  Post assessment: Post-op Vital signs reviewed, Patient's Cardiovascular Status Stable, Respiratory Function Stable, Patent Airway and No signs of Nausea or vomiting  Post vital signs: Reviewed and stable  Last Vitals:  Filed Vitals:   09/30/14 0758  BP: 139/70  Pulse: 64  Temp: 36.6 C  Resp: 16    Level of consciousness: awake, alert  and patient cooperative  Complications: No apparent anesthesia complications

## 2014-09-30 NOTE — Progress Notes (Signed)
Clinical Social Worker (CSW) received SNF consult. PT is recommending home health. RN Case Manager aware of above. Please reconsult if future social work needs arise. CSW signing off.   Amarii Amy Morgan, LCSWA [phone] 

## 2014-09-30 NOTE — Progress Notes (Signed)
Physical Therapy Treatment Patient Details Name: Linda Rios MRN: [id number] DOB: February 10, 1951 Today's Date: 09/30/2014    History of Present Illness This patient is a 64 year old female who came to [facility] for a left TKR    PT Comments    Pt making gradual progress towards goals this PM. She continues to require max assist +2 for transfers as well as +2 for bed mobility and transfers. Pain appears to be the biggest contributing factor to her limitations, however she is willing to work through the pain in order to complete her therapy. She will continue to benefit from skilled PT in order to address her strength, ROM, and gait deficits for eventual safe return home.   Follow Up Recommendations  SNF     Equipment Recommendations  None recommended by PT    Recommendations for Other Services       Precautions / Restrictions Precautions Precautions: Fall Required Braces or Orthoses: Knee Immobilizer - Left Knee Immobilizer - Left: On when out of bed or walking Restrictions Weight Bearing Restrictions: Yes (WBAT)    Mobility  Bed Mobility Overal bed mobility: Needs Assistance Bed Mobility: Sit to Supine     Supine to sit: Mod assist Sit to supine: Mod assist   General bed mobility comments: Pt requires mod assist for trunk support in lowering as well as management of LEs   Transfers Overall transfer level: Needs assistance Equipment used: Rolling walker (2 wheeled) Transfers: Sit to/from Stand Sit to Stand: Max assist;+2 physical assistance         General transfer comment: Pt requires assist for body weight support with standing. She also requires vc to proper left leg outward during transfers, as well as hand placement prior to transfers.   Ambulation/Gait Ambulation/Gait assistance: Min assist Ambulation Distance (Feet): 4 Feet Assistive device: Rolling walker (2 wheeled) Gait Pattern/deviations: Decreased step length - right;Decreased step length -  left;Shuffle;Step-to pattern Gait velocity: decreased   General Gait Details: Pt able to ambulate to bed, requiring assist for step initiation on left leg as well as guiding RW. She requires VC for sequencing of RW with gait. She also has a tendency to externally rotate and abduct the LLE, needs assist to draw foot in.    Stairs            Wheelchair Mobility    Modified Rankin (Stroke Patients Only)       Balance Overall balance assessment: No apparent balance deficits (not formally assessed)                                  Cognition Arousal/Alertness: Awake/alert Behavior During Therapy: WFL for tasks assessed/performed Overall Cognitive Status: Within Functional Limits for tasks assessed                      Exercises Total Joint Exercises Goniometric ROM: L knee AAROM: 6 - 61 degrees  Other Exercises Other Exercises: Pt performed ther-ex on bilateral LE x10 reps with min assist for facilation of movement (especially SLR and hip abd). Pt was not able to perform 10 reps of SLR on LLE successfully. Due to this, knee immobilzer was donned during mobility. All exercises included: ankle pumps, quad sets, glute sets, SLR, hip abd, and heel slides.     General Comments        Pertinent Vitals/Pain Pain Assessment: 0-10 Pain Score: 5  Pain Location: L  knee Pain Intervention(s): Limited activity within patient's tolerance;Monitored during session;Ice applied;Utilized relaxation techniques    Home Living Family/patient expects to be discharged to:: Private residence Living Arrangements: Spouse/significant other Available Help at Discharge: Family;Available 24 hours/day Type of Home: House Home Access: Stairs to enter Entrance Stairs-Rails: Can reach both Home Layout: One level Home Equipment: Walker - 2 wheels Additional Comments: Husband is able to provide 24 hr care at home    Prior Function Level of Independence: Independent       Comments: Pt was able to ambulate mostly around the home and garden surrounding her home.    PT Goals (current goals can now be found in the care plan section) Acute Rehab PT Goals Patient Stated Goal: To return to bed PT Goal Formulation: With patient Time For Goal Achievement: 10/14/14 Potential to Achieve Goals: Good Progress towards PT goals: Progressing toward goals    Frequency  BID    PT Plan Current plan remains appropriate    Co-evaluation             End of Session Equipment Utilized During Treatment: Gait belt;Left knee immobilizer Activity Tolerance: Patient limited by pain;Patient tolerated treatment well (No noted nausea following mobility) Patient left: in bed;with call bell/phone within reach;with chair alarm set;with family/visitor present;with SCD's reapplied     Time: 1610-9604 PT Time Calculation (min) (ACUTE ONLY): 28 min  Charges:  $Therapeutic Exercise: 8-22 mins $Therapeutic Activity: 8-22 mins                    G CodesJanyth Contes 10-13-2014, 2:51 PM  Janyth Contes, SPT. [phone]

## 2014-09-30 NOTE — Progress Notes (Signed)
Paged [place] (PA), call back received-updated with patient still having lots of pain inside left knee, given tramadol x2 (see MAC), order received to give IV pain medication if no relief from second tramadol in one hr. Will continue to monitor the patient.

## 2014-09-30 NOTE — Progress Notes (Signed)
Afebrile--Hgb.13.8. Ordered metabolic panel for this evening. Moderate discomfort. Pt. wants to go to SNF post discharge. Hgb. to be repeated in the AM along with metabolic panel. Foley has been D/C'd. IV has been saline locked.

## 2014-09-30 NOTE — Clinical Social Work Note (Signed)
Clinical Social Work Assessment  Patient Details  Name: Linda Rios MRN: [id number] Date of Birth: [birth year]  Date of referral:  09/30/14               Reason for consult:  Facility Placement                Permission sought to share information with:  Chartered certified accountant granted to share information::  Yes, Verbal Permission Granted  Name::      [place]::   [place]   Relationship::     Contact Information:     Housing/Transportation Living arrangements for the past 2 months:  [place] of Information:  Patient, Spouse Patient Interpreter Needed:  None Criminal Activity/Legal Involvement Pertinent to Current Situation/Hospitalization:  No - Comment as needed Significant Relationships:  Spouse Lives with:  Spouse Do you feel safe going back to the place where you live?  Yes Need for family participation in patient care:  Yes (Comment)  Care giving concerns:  Patient lives with her husband Chrissie Noa in [place], [place].    Social Worker assessment / plan:  Holiday representative (CSW) received SNF consult. PT changed recommendation from home health to SNF. CSW met with patient and her husband Chrissie Noa was at bedside. CSW introduced self and explained role of CSW department. Patient was sitting in bed alert and oriented. Patient appeared to be in pain. Patient reported that she and her husband live in [place] in [place]. Patient reported that she comes to [facility] often and all her doctors are here. CSW explained SNF process. Patient is agreeable to SNF search. Patient is agreeable to [facility] and [facility].  FL2 complete and faxed out.   Employment status:  Retired Advertising copywriter PT Recommendations:  [place] / Referral to community resources:  [place]  Patient/Family's Response to care:  Patient is agreeable to [facility] in  [place].   Patient/Family's Understanding of and Emotional Response to Diagnosis, Current Treatment, and Prognosis:  Patient and husband thanked CSW for visit and assisting with placement.   Emotional Assessment Appearance:  Appears stated age Attitude/Demeanor/Rapport:    Affect (typically observed):  Pleasant, Quiet Orientation:  Oriented to Self, Oriented to Place, Oriented to  Time, Oriented to Situation Alcohol / Substance use:  Not Applicable Psych involvement (Current and /or in the community):  No (Comment)  Discharge Needs  Concerns to be addressed:  Discharge Planning Concerns Readmission within the last 30 days:  No Current discharge risk:  Chronically ill Barriers to Discharge:  Continued Medical Work up   Loralyn Freshwater, LCSW 09/30/2014, 4:01 PM

## 2014-09-30 NOTE — Evaluation (Signed)
Occupational Therapy Evaluation Patient Details Name: ANSLIE SPADAFORA MRN: [id number] DOB: October 11, 1950 Today's Date: 09/30/2014    History of Present Illness This patient is a 64 year old female who came to [facility] for a left TKR   Clinical Impression   This patient is a 64 year old female who came to [facility] for a L total knee replacement.  Patient lives with her husband in a one story home with 2steps to enter.  She had been independent with ADL and functional mobility. She now requires assistance for lower body dressing.  She would benefit from Occupational Therapy for ADL/functioal mobility training       Follow Up Recommendations  SNF    Equipment Recommendations       Recommendations for Other Services       Precautions / Restrictions       Mobility Bed Mobility                  Transfers                      Balance                                            ADL                                         General ADL Comments: Had been independent with ADL, now needs assist for lower body dressing.  She practiced techniques for lower body dressing using hip kit with hand over hand assist. Practiced Donned/doffed socks and pants to knees (drain still in place).      Vision     Perception     Praxis      Pertinent Vitals/Pain       Hand Dominance     Extremity/Trunk Assessment Upper Extremity Assessment Upper Extremity Assessment:  (B UE 5/5)   Lower Extremity Assessment Lower Extremity Assessment: Defer to PT evaluation       Communication     Cognition Arousal/Alertness: Awake/alert Behavior During Therapy: WFL for tasks assessed/performed Overall Cognitive Status: Within Functional Limits for tasks assessed                     General Comments       Exercises       Shoulder Instructions      Home Living Family/patient expects to be discharged to::  Private residence Living Arrangements: Spouse/significant other   Type of Home: House Home Access: Stairs to enter   Entrance Stairs-Rails: Can reach both Home Layout: One level                          Prior Functioning/Environment Level of Independence: Independent             OT Diagnosis: Acute pain   OT Problem List:     OT Treatment/Interventions: Self-care/ADL training    OT Goals(Current goals can be found in the care plan section) Acute Rehab OT Goals Patient Stated Goal: For the pain to stop OT Goal Formulation: With patient/family Time For Goal Achievement: 10/14/14 Potential to Achieve Goals: Good  OT Frequency: Min 1X/week   Barriers to D/C:  Co-evaluation              End of Session Equipment Utilized During Treatment:  (Hip kit)  Activity Tolerance:   Patient left: in chair;with call bell/phone within reach;with chair alarm set;with family/visitor present   Time: 1125-1145 OT Time Calculation (min): 20 min Charges:  OT General Charges $OT Visit: 1 Procedure OT Evaluation $Initial OT Evaluation Tier I: 1 Procedure OT Treatments $Self Care/Home Management : 8-22 mins G-Codes:    Myrene Galas, MS/OTR/L  09/30/2014, 11:54 AM

## 2014-09-30 NOTE — Anesthesia Postprocedure Evaluation (Incomplete)
  Anesthesia Post-op Note  Patient: Linda Rios  Procedure(s) Performed: Procedure(s): TOTAL KNEE ARTHROPLASTY (Left)  Anesthesia type:Regional, Epidural  Patient location: PACU  Post pain: Pain level controlled  Post assessment: Post-op Vital signs reviewed, Patient's Cardiovascular Status Stable, Respiratory Function Stable, Patent Airway and No signs of Nausea or vomiting  Post vital signs: Reviewed and stable  Last Vitals:  Filed Vitals:   09/30/14 0335  BP: 113/82  Pulse: 71  Temp: 36.7 C  Resp: 18    Level of consciousness: awake, alert  and patient cooperative  Complications: No apparent anesthesia complications

## 2014-10-01 LAB — URINALYSIS COMPLETE WITH MICROSCOPIC (ARMC ONLY)
BACTERIA UA: NONE SEEN
Bilirubin Urine: NEGATIVE
Glucose, UA: NEGATIVE mg/dL
Ketones, ur: NEGATIVE mg/dL
Leukocytes, UA: NEGATIVE
Nitrite: NEGATIVE
PH: 7 (ref 5.0–8.0)
Protein, ur: NEGATIVE mg/dL
Specific Gravity, Urine: 1.009 (ref 1.005–1.030)

## 2014-10-01 LAB — CBC
HCT: 36.9 % (ref 35.0–47.0)
HEMOGLOBIN: 12.4 g/dL (ref 12.0–16.0)
MCH: 28.9 pg (ref 26.0–34.0)
MCHC: 33.5 g/dL (ref 32.0–36.0)
MCV: 86.4 fL (ref 80.0–100.0)
Platelets: 214 10*3/uL (ref 150–440)
RBC: 4.28 MIL/uL (ref 3.80–5.20)
RDW: 13.9 % (ref 11.5–14.5)
WBC: 14.7 10*3/uL — ABNORMAL HIGH (ref 3.6–11.0)

## 2014-10-01 LAB — BASIC METABOLIC PANEL
Anion gap: 8 (ref 5–15)
BUN: 11 mg/dL (ref 6–20)
CALCIUM: 8.4 mg/dL — AB (ref 8.9–10.3)
CHLORIDE: 97 mmol/L — AB (ref 101–111)
CO2: 28 mmol/L (ref 22–32)
Creatinine, Ser: 0.76 mg/dL (ref 0.44–1.00)
GFR calc non Af Amer: >60 mL/min (ref >60)
Glucose, Bld: 130 mg/dL — ABNORMAL HIGH (ref 65–99)
POTASSIUM: 4.1 mmol/L (ref 3.5–5.1)
SODIUM: 133 mmol/L — AB (ref 135–145)

## 2014-10-01 MED ORDER — OXYCODONE HCL 10 MG PO TABS: 10.0000 mg | ORAL_TABLET | ORAL | Status: DC | PRN

## 2014-10-01 MED ORDER — OXYCODONE HCL 5 MG PO TABS
10.0000 mg | ORAL_TABLET | ORAL | Status: DC | PRN
  Administered 2014-10-01: 15 mg via ORAL
  Administered 2014-10-01: 10 mg via ORAL
  Administered 2014-10-01: 15 mg via ORAL
  Administered 2014-10-01: 10 mg via ORAL
  Administered 2014-10-02: 5 mg via ORAL
  Administered 2014-10-02: 15 mg via ORAL
  Administered 2014-10-02: 10 mg via ORAL
  Administered 2014-10-02: 5 mg via ORAL
  Administered 2014-10-02: 15 mg via ORAL
  Administered 2014-10-02: 10 mg via ORAL
  Filled 2014-10-01: qty 2
  Filled 2014-10-01: qty 1
  Filled 2014-10-01: qty 3
  Filled 2014-10-01: qty 1
  Filled 2014-10-01: qty 3
  Filled 2014-10-01 (×2): qty 2
  Filled 2014-10-01 (×3): qty 3
  Filled 2014-10-01: qty 2

## 2014-10-01 MED ORDER — ENOXAPARIN SODIUM 40 MG/0.4ML ~~LOC~~ SOLN: 40.0000 mg | SUBCUTANEOUS | Status: DC

## 2014-10-01 MED ORDER — BISACODYL 10 MG RE SUPP
10.0000 mg | Freq: Every day | RECTAL | Status: DC | PRN
  Administered 2014-10-01: 10 mg via RECTAL
  Filled 2014-10-01 (×2): qty 1

## 2014-10-01 NOTE — Progress Notes (Signed)
Patient's pain being controlled by alternating oxy & tramadol on regular intervals. Plan for d/c tomorrow to [facility]. Give bisacodyl for b/m. Continue to monitor.

## 2014-10-01 NOTE — Progress Notes (Signed)
  Subjective: 2 Days Post-Op Procedure(s) (LRB): TOTAL KNEE ARTHROPLASTY (Left) Patient reports pain as 7 on 0-10 scale.   Patient is well, and has had no acute complaints or problems Plan is to go Skilled nursing facility after hospital stay. Negative for chest pain and shortness of breath Fever: no Gastrointestinal:Negative for nausea and vomiting  Objective: Vital signs in last 24 hours: Temp:  [97.6 F (36.4 C)-98.9 F (37.2 C)] 98.6 F (37 C) (07/22 0442) Pulse Rate:  [63-103] 103 (07/22 0442) Resp:  [16-18] 18 (07/22 0442) BP: (127-162)/(68-86) 162/86 mmHg (07/22 0442) SpO2:  [94 %-100 %] 94 % (07/22 0442)  Intake/Output from previous day:  Intake/Output Summary (Last 24 hours) at 10/01/14 0753 Last data filed at 10/01/14 4765  Gross per 24 hour  Intake    720 ml  Output   1650 ml  Net   -930 ml    Intake/Output this shift:    Labs:  Recent Labs  09/29/14 1353 10/01/14 0601  HGB 13.8 12.4    Recent Labs  09/29/14 1353 10/01/14 0601  WBC 10.1 14.7*  RBC 4.79 4.28  HCT 41.8 36.9  PLT 216 214    Recent Labs  09/30/14 1933 10/01/14 0601  NA 134* 133*  K 4.3 4.1  CL 97* 97*  CO2 30 28  BUN 15 11  CREATININE 0.95 0.76  GLUCOSE 123* 130*  CALCIUM 8.5* 8.4*   No results for input(s): LABPT, INR in the last 72 hours.   EXAM General - Patient is Alert, Appropriate and Oriented Extremity - Neurologically intact ABD soft Dorsiflexion/Plantar flexion intact Incision: dressing C/D/I Dressing/Incision - clean, dry, no drainage Motor Function - intact, moving foot and toes well on exam.   Dressing changed today.  Dressing clean and dry  Past Medical History  Diagnosis Date  . Arthritis   . Hyperlipidemia   . Diverticulosis     Assessment/Plan: 2 Days Post-Op Procedure(s) (LRB): TOTAL KNEE ARTHROPLASTY (Left) Active Problems:   S/P total knee replacement  Estimated body mass index is 41.99 kg/(m^2) as calculated from the following:  Height as of this encounter: 5\' 6"  (1.676 m).   Weight as of this encounter: 117.935 kg (260 lb). Advance diet Up with therapy   Pt needs to have BM.  Has Miralax ordered as need. Pt still complaining of pain.  Will increase oxycodone 5mg  to 2-3 tablets q 3 hours and keep tramadol 50-100 q 6 hours as needed for pain. WC elevated to 14.7.  Pt not complaining of N/V, cough or urinary symptoms.Will order UA and will continue to monitor. Encouraged Incentive spirometer. Na 133.  Pt is off IV fluids and tolerating PO intake. Labs reviewed.  CBC and BMP ordered for tomorrow. Quad muscles feel tight on exam.  Dressing loosened today, will check tomorrow.  DVT Prophylaxis - Lovenox, Foot Pumps and TED hose Weight-Bearing as tolerated to Left leg  J. Cameron Proud, PA-C [facility] 10/01/2014, 7:53 AM

## 2014-10-01 NOTE — Progress Notes (Signed)
OT Cancellation Note  Patient Details Name: EMMILIA SOWDER MRN: [id number] DOB: 1950-05-08   Cancelled Treatment:    Reason Eval/Treat Not Completed:  (Patient declined dressing activity.)  Myrene Galas, MS/OTR/L  10/01/2014, 3:51 PM

## 2014-10-01 NOTE — Progress Notes (Deleted)
Patient is confused sitter at bedside. Patient has multiple family members at bedside after visiting hours. Patient experiencing intermittent visual and physical halucations. Patient pulled out IV. IV restarted. Family members ask to quiet down and leave to decrease stimulation. Family upset. Per family request wanted all bed rails up on bed. Patient teaching giving to family about restraints and need of doctors order. Family angry and using profane language. Nursing will continue to monitor. One family member at bedside during hs.

## 2014-10-01 NOTE — Discharge Instructions (Signed)
Diet: As you were doing prior to hospitalization   Shower:  May shower but keep the wounds dry, use an occlusive plastic wrap, NO SOAKING IN TUB.  If the bandage gets wet, change with a clean dry gauze.  Dressing:  You may change your dressing as needed. Change the dressing with sterile gauze dressing.    Activity:  Increase activity slowly as tolerated, but follow the weight bearing instructions below.  No lifting or driving for 6 weeks.  Weight Bearing:   Weight bearing as tolerated to left/right lower extremity  To prevent constipation: you may use a stool softener such as -  Colace (over the counter) 100 mg by mouth twice a day  Drink plenty of fluids (prune juice may be helpful) and high fiber foods Miralax (over the counter) for constipation as needed.    Itching:  If you experience itching with your medications, try taking only a single pain pill, or even half a pain pill at a time.  You may take up to 10 pain pills per day, and you can also use benadryl over the counter for itching or also to help with sleep.   Precautions:  If you experience chest pain or shortness of breath - call 911 immediately for transfer to the hospital emergency department!!  If you develop a fever greater that 101 F, purulent drainage from wound, increased redness or drainage from wound, or calf pain-Call [facility]                                              Follow- Up Appointment:  Please call for an appointment to be seen in 2 weeks at [facility]

## 2014-10-01 NOTE — Progress Notes (Signed)
Clinical Education officer, museum (CSW) presented bed offers to patient and husband. They chose [facility]. Plan is for patient to D/C to [facility] tomorrow 10/02/14. Per [employer] admissions coordinator at [facility] patient is going to private room 605 on [facility]. CSW sent D/C Summary to [facility] via carefinder today. [employer] employee at [facility] is coming to complete paper work with patient today. CSW will continue to follow and assist as needed.   Blima Rich, [place] [phone]

## 2014-10-01 NOTE — Progress Notes (Signed)
Physical Therapy Treatment Patient Details Name: Linda Rios MRN: [id number] DOB: 1950/06/20 Today's Date: 10/01/2014    History of Present Illness This patient is a 64 year old female who came to [facility] for a left TKR    PT Comments    Pt has made progress towards goals this AM evident by increased ambulation distance and increased exercise tolerance. She remains limited by pain during mobility, but is willing to try hard during therapy. Due to decreases in ROM, strength, and gait performance, pt will benefit from skilled PT in order for eventual safe return home. Pt and husband agreed with current PT recommendation for SNF after discharge.   Follow Up Recommendations  SNF     Equipment Recommendations  None recommended by PT    Recommendations for Other Services       Precautions / Restrictions Precautions Precautions: Fall Required Braces or Orthoses: Knee Immobilizer - Left Knee Immobilizer - Left: On when out of bed or walking Restrictions Weight Bearing Restrictions: Yes (WBAT)    Mobility  Bed Mobility Overal bed mobility: Needs Assistance Bed Mobility: Supine to Sit     Supine to sit: Mod assist     General bed mobility comments: Pt requires assist for LLE management and trunk support, however she demonstrates better ability to initiate movment of her LLE outward towards EOB. Pt able to manage her pain through breathing techniques   Transfers Overall transfer level: Needs assistance Equipment used: Rolling walker (2 wheeled) Transfers: Sit to/from Stand Sit to Stand: Max assist;+2 physical assistance         General transfer comment: Pt still requires assist for body support and needs cues to stand tall to complete her stand. Once in standing she appears stable with no LOB.  (Pain potentially limiting transfer ability )  Ambulation/Gait Ambulation/Gait assistance: Min guard Ambulation Distance (Feet): 35 Feet Assistive device: Rolling walker (2  wheeled) Gait Pattern/deviations: Step-to pattern;Decreased step length - right;Decreased step length - left;Shuffle Gait velocity: decreased   General Gait Details: Pt ambulated further this AM but still requires heavy VC for sequencing of RW with gait. She also needs cueing to lessen the impact on her right heel during touch down.    Stairs            Wheelchair Mobility    Modified Rankin (Stroke Patients Only)       Balance Overall balance assessment: No apparent balance deficits (not formally assessed)                                  Cognition Arousal/Alertness: Awake/alert Behavior During Therapy: WFL for tasks assessed/performed Overall Cognitive Status: Within Functional Limits for tasks assessed                      Exercises Total Joint Exercises Goniometric ROM: L knee AAROM 4 - 68 degrees (Pt limited by pain during flexion. ) Other Exercises Other Exercises: Pt performed ther-ex on bilateral LE x12 reps with min assist for facilation of movement (especially SLR and hip abd). Pt was not able to perform 10 reps of SLR on LLE successfully. Due to this, knee immobilzer was donned during mobility. All exercises included: ankle pumps, quad sets, glute sets, SLR, hip abd, and heel slides. Pt's exercise tolerance has increased since yesterday.  (Ther-ex packet to be issued this afternoon )    General Comments  Pertinent Vitals/Pain Pain Assessment: 0-10 Pain Score: 8  Pain Location: L knee Pain Descriptors / Indicators: Throbbing Pain Intervention(s): Limited activity within patient's tolerance;RN gave pain meds during session;Ice applied;Monitored during session;Utilized relaxation techniques    Home Living                      Prior Function            PT Goals (current goals can now be found in the care plan section) Acute Rehab PT Goals Patient Stated Goal: To attempt to walk to door PT Goal Formulation: With  patient Time For Goal Achievement: 10/14/14 Potential to Achieve Goals: Good Progress towards PT goals: Progressing toward goals (Progressing, however still limited by pain)    Frequency  BID    PT Plan Current plan remains appropriate    Co-evaluation             End of Session Equipment Utilized During Treatment: Gait belt;Left knee immobilizer Activity Tolerance: Patient limited by pain;Patient tolerated treatment well Patient left: in chair;with call bell/phone within reach;with chair alarm set;with SCD's reapplied     Time: 9678-9381 PT Time Calculation (min) (ACUTE ONLY): 39 min  Charges:                       G CodesJanyth Contes October 17, 2014, 11:54 AM  Janyth Contes, SPT. [phone]

## 2014-10-01 NOTE — Progress Notes (Signed)
Physical Therapy Treatment Patient Details Name: Linda Rios MRN: [id number] DOB: August 23, 1950 Today's Date: 10/01/2014    History of Present Illness This patient is a 64 year old female who came to [facility] for a left TKR    PT Comments    Pt is progressing towards goals this PM. Further ambulation and less pain suggests she will continue to do well with her therapy. Per nursing, pt was given her pain medication 30 minutes before mobility, which pt said was the duration from mobility that it has felt the best. KI still to be donned for ambulation. Pt will continue to benefit from skilled PT in order to address strength, ROM and gait deficits. Pt and husband still open to SNF recommendation.   Follow Up Recommendations  SNF     Equipment Recommendations  None recommended by PT    Recommendations for Other Services       Precautions / Restrictions Precautions Precautions: Fall Required Braces or Orthoses: Knee Immobilizer - Left Knee Immobilizer - Left: On when out of bed or walking Restrictions Weight Bearing Restrictions: Yes (WBAT)    Mobility  Bed Mobility Overal bed mobility: Needs Assistance Bed Mobility: Sit to Supine       Sit to supine: Mod assist   General bed mobility comments: Pt requires minmal lowering assist and managment of LLE. She tolerates pain with movement much better this afternoon  Transfers Overall transfer level: Needs assistance Equipment used: Rolling walker (2 wheeled) Transfers: Sit to/from Stand Sit to Stand: Max assist;+2 physical assistance         General transfer comment: Pt still requires assist for body support (but less than this AM) and needs cues to stand tall to complete her stand. Once in standing she appears stable with no LOB.    Ambulation/Gait Ambulation/Gait assistance: Min guard Ambulation Distance (Feet): 70 Feet Assistive device: Rolling walker (2 wheeled) Gait Pattern/deviations: Decreased step length -  right;Decreased step length - left;Step-to pattern Gait velocity: decreased   General Gait Details: Pt ambulated further this PM but still requires heavy VC for sequencing of RW with gait. She also needs cueing to lessen the impact on her right heel during touch down. Needs cueing to advance right leg further, and occasionally reduce the step length on the left.     Stairs            Wheelchair Mobility    Modified Rankin (Stroke Patients Only)       Balance Overall balance assessment: No apparent balance deficits (not formally assessed)                                  Cognition Arousal/Alertness: Awake/alert Behavior During Therapy: WFL for tasks assessed/performed Overall Cognitive Status: Within Functional Limits for tasks assessed                      Exercises Other Exercises Other Exercises: Pt performed ther-ex on bilateral LE x12 reps with min assist for facilation of movement (especially SLR and hip abd). Pt was not able to perform 10 reps of SLR on LLE successfully. Due to this, knee immobilzer was donned during mobility. All exercises included: ankle pumps, quad sets, glute sets, SLR, hip abd, and heel slides. Pt's exercise tolerance has increased since yesterday.     General Comments        Pertinent Vitals/Pain Pain Assessment: 0-10 Pain Score: 5  Pain Location: L knee Pain Descriptors / Indicators: Throbbing Pain Intervention(s): Limited activity within patient's tolerance;Monitored during session;Premedicated before session;Utilized relaxation techniques;Ice applied    Home Living                      Prior Function            PT Goals (current goals can now be found in the care plan section) Acute Rehab PT Goals Patient Stated Goal: Walk further PT Goal Formulation: With patient Time For Goal Achievement: 10/14/14 Potential to Achieve Goals: Good Progress towards PT goals: Progressing toward goals     Frequency  BID    PT Plan Current plan remains appropriate    Co-evaluation             End of Session Equipment Utilized During Treatment: Gait belt;Left knee immobilizer Activity Tolerance: Patient tolerated treatment well Patient left: in bed;with bed alarm set;with SCD's reapplied;with family/visitor present     Time: 4166-0630 PT Time Calculation (min) (ACUTE ONLY): 28 min  Charges:                       G CodesJanyth Contes 2014/10/15, 3:37 PM Janyth Contes, SPT. [phone]

## 2014-10-01 NOTE — Discharge Summary (Signed)
Physician Discharge Summary  Patient ID: Linda Rios MRN: [id number] DOB/AGE: 1951/03/04 64 y.o.  Admit date: 09/29/2014 Discharge date: 10/01/2014  Admission Diagnoses:  DEGENERATIVE OSTEOARTHRITIS    Discharge Diagnoses: Patient Active Problem List   Diagnosis Date Noted  . S/P total knee replacement 09/29/2014    Past Medical History  Diagnosis Date  . Arthritis   . Hyperlipidemia   . Diverticulosis      Transfusion: None   Consultants (if any):    Discharged Condition: Improved  Hospital Course: Linda Rios is an 64 y.o. female who was admitted 09/29/2014 with a diagnosis of degenerative arthrosis of the left knee and went to the operating room on 09/29/2014 and underwent the above named procedures.    Surgeries: Procedure(s): TOTAL KNEE ARTHROPLASTY on 09/29/2014 Patient tolerated the surgery well. Taken to PACU where she was stabilized and then transferred to the orthopedic floor.  Started on Lovenox 30mg  q 12 hrs. Foot pumps applied bilaterally at 80 mm. Heels elevated on bed with rolled towels. No evidence of DVT. Negative Homan. Physical therapy started on day #1 for gait training and transfer. OT started day #1 for ADL and assisted devices.  Patient's IV , foley was d/c on POD 1   Implants: #4 triathlon posterior stabilized femoral component, a #5 tibial baseplate, a #59 mm thick tibial bearing insert, a 10 mm thick asymmetric patella that was 32 mm in diameter  She was given perioperative antibiotics:      Anti-infectives    Start     Dose/Rate Route Frequency Ordered Stop   09/29/14 1400  ceFAZolin (ANCEF) IVPB 1 g/50 mL premix     1 g 100 mL/hr over 30 Minutes Intravenous 3 times per day 09/29/14 1310 09/30/14 0628   09/29/14 0715  ceFAZolin (ANCEF) IVPB 2 g/50 mL premix     2 g 100 mL/hr over 30 Minutes Intravenous  Once 09/29/14 0700 09/29/14 1107   09/29/14 0600  ceFAZolin (ANCEF) 2-3 GM-% IVPB SOLR    Comments:  LEWIS, CINDY: cabinet  override      09/29/14 0600 09/29/14 1759   09/29/14 0430  vancomycin (VANCOCIN) IVPB 1000 mg/200 mL premix     1,000 mg 200 mL/hr over 60 Minutes Intravenous  Once 09/29/14 0429 09/29/14 0805    .  She was given sequential compression devices, early ambulation, and lovenox for DVT prophylaxis.  She benefited maximally from the hospital stay and there were no complications.    Recent vital signs:  Filed Vitals:   10/01/14 0833  BP: 166/83  Pulse: 95  Temp: 98.2 F (36.8 C)  Resp: 18    Recent laboratory studies:  Lab Results  Component Value Date   HGB 12.4 10/01/2014   HGB 13.8 09/29/2014   HGB 14.5 09/15/2014   Lab Results  Component Value Date   WBC 14.7* 10/01/2014   PLT 214 10/01/2014   Lab Results  Component Value Date   INR 0.97 09/15/2014   Lab Results  Component Value Date   NA 133* 10/01/2014   K 4.1 10/01/2014   CL 97* 10/01/2014   CO2 28 10/01/2014   BUN 11 10/01/2014   CREATININE 0.76 10/01/2014   GLUCOSE 130* 10/01/2014    Discharge Medications:     Medication List    ASK your doctor about these medications        CALCIUM 600+D 600-800 MG-UNIT Tabs  Generic drug:  Calcium Carb-Cholecalciferol  Take 1 tablet by mouth daily.  diphenhydramine-acetaminophen 25-500 MG Tabs  Commonly known as:  TYLENOL PM  Take 2 tablets by mouth at bedtime as needed (for sleep).     GLUCOSAMINE CHONDR 1500 COMPLX PO  Take 1 tablet by mouth 2 (two) times daily.     lovastatin 20 MG tablet  Commonly known as:  MEVACOR  Take 20 mg by mouth at bedtime.     meloxicam 7.5 MG tablet  Commonly known as:  MOBIC  Take 7.5 mg by mouth daily.     niacin 500 MG tablet  Take 500 mg by mouth 2 (two) times daily.     ONE DAILY FOR WOMEN Tabs  Take 1 tablet by mouth daily.        Diagnostic Studies: X-ray Knee Left Ap And Lateral  09/29/2014   CLINICAL DATA:  Left total knee replacement.  EXAM: LEFT KNEE - 1-2 VIEW  COMPARISON:  None.  FINDINGS: AP and  lateral views demonstrate the components of the total knee prosthesis appear in excellent position. No fractures. Postsurgical air in the soft tissues.  IMPRESSION: Satisfactory appearance of the left knee after left total knee replacement.   Electronically Signed   By: Lorriane Shire M.D.   On: 09/29/2014 12:44    Disposition: Pt is stable and ready for discharge.  Pt steadily improved over her inpatient course following surgery.  Pt one concern throughout her stay was pain control.  Plan is for d/c to SNF on 10/02/14.  Currently the plan is to be discharged to [facility].   Follow-up Information    Follow up with [name], MD.   Specialty:  Unknown Physician Specialty   Contact information:   [place] [id number] [phone]       Follow up In 2 weeks.   Why:  For suture removal, For wound re-check       Signed: Judson Roch PA-C 10/01/2014, 9:42 AM

## 2014-10-02 LAB — BASIC METABOLIC PANEL
ANION GAP: 5 (ref 5–15)
BUN: 10 mg/dL (ref 6–20)
CO2: 30 mmol/L (ref 22–32)
Calcium: 8.3 mg/dL — ABNORMAL LOW (ref 8.9–10.3)
Chloride: 97 mmol/L — ABNORMAL LOW (ref 101–111)
Creatinine, Ser: 0.77 mg/dL (ref 0.44–1.00)
GFR calc Af Amer: >60 mL/min (ref >60)
GFR calc non Af Amer: >60 mL/min (ref >60)
Glucose, Bld: 125 mg/dL — ABNORMAL HIGH (ref 65–99)
POTASSIUM: 4.7 mmol/L (ref 3.5–5.1)
SODIUM: 132 mmol/L — AB (ref 135–145)

## 2014-10-02 LAB — CBC
HCT: 36.2 % (ref 35.0–47.0)
Hemoglobin: 12 g/dL (ref 12.0–16.0)
MCH: 28.7 pg (ref 26.0–34.0)
MCHC: 33.1 g/dL (ref 32.0–36.0)
MCV: 86.5 fL (ref 80.0–100.0)
PLATELETS: 201 10*3/uL (ref 150–440)
RBC: 4.19 MIL/uL (ref 3.80–5.20)
RDW: 13.9 % (ref 11.5–14.5)
WBC: 14.3 10*3/uL — ABNORMAL HIGH (ref 3.6–11.0)

## 2014-10-02 MED ORDER — BISACODYL 10 MG RE SUPP
10.0000 mg | Freq: Once | RECTAL | Status: AC
Start: 2014-10-02 — End: 2014-10-02
  Administered 2014-10-02: 10 mg via RECTAL

## 2014-10-02 MED ORDER — LACTULOSE 10 GM/15ML PO SOLN
10.0000 g | Freq: Two times a day (BID) | ORAL | Status: DC | PRN
  Administered 2014-10-02: 10 g via ORAL
  Filled 2014-10-02: qty 30

## 2014-10-02 MED ORDER — FLEET ENEMA 7-19 GM/118ML RE ENEM
1.0000 | ENEMA | Freq: Every day | RECTAL | Status: DC | PRN
  Administered 2014-10-02: 1 via RECTAL
  Filled 2014-10-02: qty 1

## 2014-10-02 NOTE — Progress Notes (Signed)
   Subjective: 3 Days Post-Op Procedure(s) (LRB): TOTAL KNEE ARTHROPLASTY (Left) Patient reports pain as mild.   Patient is well, and has had no acute complaints or problems Continue with physical therapy today.  Plan is to go Rehab after hospital stay. no nausea and no vomiting Patient denies any chest pains or shortness of breath. Objective: Vital signs in last 24 hours: Temp:  [98.2 F (36.8 C)-99.2 F (37.3 C)] 98.2 F (36.8 C) (07/23 0714) Pulse Rate:  [93-101] 93 (07/23 0714) Resp:  [16-18] 16 (07/23 0714) BP: (144-167)/(82-88) 154/83 mmHg (07/23 0714) SpO2:  [95 %-100 %] 95 % (07/23 0714) well approximated incision Heels are non tender and elevated off the bed using rolled towels Intake/Output from previous day: 07/22 0701 - 07/23 0700 In: 480 [P.O.:480] Out: 1500 [Urine:1500] Intake/Output this shift:     Recent Labs  09/29/14 1353 10/01/14 0601 10/02/14 0429  HGB 13.8 12.4 12.0    Recent Labs  10/01/14 0601 10/02/14 0429  WBC 14.7* 14.3*  RBC 4.28 4.19  HCT 36.9 36.2  PLT 214 201    Recent Labs  10/01/14 0601 10/02/14 0429  NA 133* 132*  K 4.1 4.7  CL 97* 97*  CO2 28 30  BUN 11 10  CREATININE 0.76 0.77  GLUCOSE 130* 125*  CALCIUM 8.4* 8.3*   No results for input(s): LABPT, INR in the last 72 hours.  EXAM General - Patient is Alert, Appropriate and Oriented Extremity - Neurologically intact Neurovascular intact Sensation intact distally Intact pulses distally Dorsiflexion/Plantar flexion intact Dressing - scant drainage Motor Function - intact, moving foot and toes well on exam.   Past Medical History  Diagnosis Date  . Arthritis   . Hyperlipidemia   . Diverticulosis     Assessment/Plan: 3 Days Post-Op Procedure(s) (LRB): TOTAL KNEE ARTHROPLASTY (Left) Active Problems:   S/P total knee replacement  Estimated body mass index is 41.99 kg/(m^2) as calculated from the following:   Height as of this encounter: 5\' 6"  (1.676  m).   Weight as of this encounter: 117.935 kg (260 lb). Up with therapy Discharge to SNF today after PT and pt has a bowel movement   Labs: reviewed  DVT Prophylaxis - Lovenox, Foot Pumps and TED hose Weight-Bearing as tolerated to left leg Needs a bowel movement today. Lactulose ordered   Jillyn Ledger. [place] 10/02/2014, 7:40 AM

## 2014-10-02 NOTE — Progress Notes (Signed)
Physical Therapy Treatment Patient Details Name: Linda Rios MRN: [id number] DOB: [birth year] Today's Date: 10/02/2014    History of Present Illness This patient is a 64 year old female who came to [facility] for a left TKR    PT Comments    Pt was seen for second visit with PT moving pt to bed and working on her transfers and there ex.  Pt still has not had Bowel movement and cannot leave until this is accomplished.  Will see in AM if she is still here.  Follow Up Recommendations  SNF     Equipment Recommendations  None recommended by PT    Recommendations for Other Services       Precautions / Restrictions Precautions Precautions: Fall;Knee Restrictions Weight Bearing Restrictions: Yes Other Position/Activity Restrictions: WBAT    Mobility  Bed Mobility Overal bed mobility: Needs Assistance Bed Mobility: Sit to Supine     Supine to sit: Mod assist Sit to supine: Mod assist   General bed mobility comments: Working well to scoot back on bed and then to help lift in LLE  Transfers Overall transfer level: Needs assistance Equipment used: Rolling walker (2 wheeled) Transfers: Sit to/from [facility] Sit to Stand: Mod assist Stand pivot transfers: Min assist       General transfer comment: assisted to stop and control standing before taking any steps  Ambulation/Gait Ambulation/Gait assistance: Min assist Ambulation Distance (Feet): 8 Feet Assistive device: Rolling walker (2 wheeled) Gait Pattern/deviations: Step-to pattern;Decreased weight shift to left;Decreased dorsiflexion - left;Wide base of support Gait velocity: decreased   General Gait Details: Continues to have some need to cue pattern and to control her LLE which she wants to put off to side and not stand fully on it.   Stairs            Wheelchair Mobility    Modified Rankin (Stroke Patients Only)       Balance Overall balance assessment: Needs assistance Sitting-balance  support: Feet supported Sitting balance-Leahy Scale: Good     Standing balance support: Bilateral upper extremity supported Standing balance-Leahy Scale: Fair                      Cognition Arousal/Alertness: Awake/alert Behavior During Therapy: WFL for tasks assessed/performed Overall Cognitive Status: Within Functional Limits for tasks assessed                      Exercises Total Joint Exercises Ankle Circles/Pumps: AROM;Both;10 reps Quad Sets: AROM;Both;10 reps Heel Slides: AAROM;Left;10 reps Hip ABduction/ADduction: AROM;Both;10 reps Long Arc Quad: AROM;Both;10 reps Goniometric ROM: 70 flexion and ext -5    General Comments        Pertinent Vitals/Pain Pain Assessment: Faces Pain Score: 7  Faces Pain Scale: Hurts little more Pain Location: L knee Pain Descriptors / Indicators: Operative site guarding Pain Intervention(s): Limited activity within patient's tolerance;Monitored during session;Repositioned;Premedicated before session;Ice applied    Home Living                      Prior Function            PT Goals (current goals can now be found in the care plan section) Acute Rehab PT Goals Patient Stated Goal: to get to SNF Progress towards PT goals: Progressing toward goals    Frequency  BID    PT Plan Current plan remains appropriate    Co-evaluation  End of Session Equipment Utilized During Treatment: Gait belt Activity Tolerance: Patient tolerated treatment well Patient left: in bed;with call bell/phone within reach;with bed alarm set;with family/visitor present     Time: 1030-1109 PT Time Calculation (min) (ACUTE ONLY): 39 min  Charges:  $Gait Training: 8-22 mins $Therapeutic Exercise: 8-22 mins $Therapeutic Activity: 23-37 mins                    G Codes:      Ramond Dial 10/31/2014, 1:15 PM   Mee Hives, PT MS Acute Rehab Dept. Number: [facility] [id number] and [place] [phone]

## 2014-10-02 NOTE — Progress Notes (Signed)
Patient is medically stable for D/C to [facility] today. Per Crystal admissions coordinator at [facility] patient is going to room 601. RN will call report to RN on [facility] and arrange EMS for transport pending bm. Clinical Education officer, museum (CSW) prepared D/C packet and sent D/C Summary to [employer]. Patient's husband is at bedside and aware of above. Please reconsult if future social work needs arise. CSW signing off.   Blima Rich, [place] [phone]

## 2014-10-02 NOTE — Progress Notes (Signed)
Patient is to be discharged today after she has a b/m. Paperwork in her chart for [facility], they are waiting for her. Call EMS when ready to transfer. Patient has been working well with PT. Continuing oxy & tramadol for pain management. Removed IV.

## 2014-10-02 NOTE — Progress Notes (Signed)
Physical Therapy Treatment Patient Details Name: Linda Rios MRN: [id number] DOB: 1951-03-04 Today's Date: 10/02/2014    History of Present Illness This patient is a 64 year old female who came to [facility] for a left TKR    PT Comments    Pt was seen for evaluation of her current status and is planning to go to SNF but needs to have a bowel movement first.  PT got her to BR and propped LLE at comfort level and then back to chair.  Her husband is in attendance and is able to see her PT findings.  Follow Up Recommendations  SNF     Equipment Recommendations  None recommended by PT    Recommendations for Other Services       Precautions / Restrictions Precautions Precautions: Fall Restrictions Weight Bearing Restrictions: Yes Other Position/Activity Restrictions: WBAT    Mobility  Bed Mobility Overal bed mobility: Needs Assistance Bed Mobility: Supine to Sit     Supine to sit: Mod assist     General bed mobility comments: Mainly needs LL E help due to pain but also keeping L knee very straight  Transfers Overall transfer level: Needs assistance Equipment used: Rolling walker (2 wheeled) Transfers: Sit to/from [facility] Sit to Stand: Mod assist Stand pivot transfers: Mod assist       General transfer comment: takes a moment to get control of posture and balance once standing and to pull LE's closer together  Ambulation/Gait Ambulation/Gait assistance: Min assist Ambulation Distance (Feet): 40 Feet Assistive device: Rolling walker (2 wheeled) Gait Pattern/deviations: Step-through pattern;Decreased stride length;Wide base of support Gait velocity: decreased   General Gait Details: Walked with cues for sequence and control of her LLE, which is tending to overreach and then somewhat difficult to catch with RLE   Stairs            Wheelchair Mobility    Modified Rankin (Stroke Patients Only)       Balance                                     Cognition Arousal/Alertness: Awake/alert Behavior During Therapy: WFL for tasks assessed/performed Overall Cognitive Status: Within Functional Limits for tasks assessed                      Exercises Total Joint Exercises Ankle Circles/Pumps: AROM;Both;10 reps Quad Sets: AROM;Both;10 reps Long Arc Quad: AROM;Both;10 reps Goniometric ROM: 70 flexion and ext -5    General Comments        Pertinent Vitals/Pain Pain Assessment: 0-10 Pain Score: 7  Pain Location: L knee Pain Descriptors / Indicators: Operative site guarding Pain Intervention(s): Limited activity within patient's tolerance;Monitored during session;Premedicated before session;Repositioned    Home Living                      Prior Function            PT Goals (current goals can now be found in the care plan section) Acute Rehab PT Goals Patient Stated Goal: Walk further Progress towards PT goals: Progressing toward goals    Frequency  BID    PT Plan Current plan remains appropriate    Co-evaluation             End of Session Equipment Utilized During Treatment: Gait belt;Left knee immobilizer Activity Tolerance: Patient tolerated treatment well Patient  left: with family/visitor present;in chair;with chair alarm set;Other (comment) (polar care)     Time: [phone] (and [phone] to [phone]) PT Time Calculation (min) (ACUTE ONLY): 22 min  Charges:  $Gait Training: 8-22 mins $Therapeutic Exercise: 8-22 mins $Therapeutic Activity: 8-22 mins                    G Codes:      Ramond Dial 10/09/14, 1:05 PM   Mee Hives, PT MS Acute Rehab Dept. Number: [facility] [id number] and [place] [phone]

## 2014-10-02 NOTE — Progress Notes (Signed)
Called report to [place] at [facility].  Belongings packed IV removed EMS called.

## 2014-10-02 NOTE — Clinical Social Work Placement (Signed)
   CLINICAL SOCIAL WORK PLACEMENT  NOTE  Date:  10/02/2014  Patient Details  Name: Linda Rios MRN: [id number] Date of Birth: [birth year]  Clinical Social Work is seeking post-discharge placement for this patient at the [place] level of care (*CSW will initial, date and re-position this form in  chart as items are completed):  Yes   Patient/family provided with [place] Work Department's list of facilities offering this level of care within the geographic area requested by the patient (or if unable, by the patient's family).  Yes   Patient/family informed of their freedom to choose among providers that offer the needed level of care, that participate in Medicare, Medicaid or managed care program needed by the patient, have an available bed and are willing to accept the patient.  Yes   Patient/family informed of [facility]'s ownership interest in [facility] and [facility], as well as of the fact that they are under no obligation to receive care at these facilities.  PASRR submitted to EDS on 09/30/14     PASRR number received on 09/30/14     Existing PASRR number confirmed on       FL2 transmitted to all facilities in geographic area requested by pt/family on 09/30/14     FL2 transmitted to all facilities within larger geographic area on       Patient informed that his/her managed care company has contracts with or will negotiate with certain facilities, including the following:        Yes   Patient/family informed of bed offers received.  Patient chooses bed at  [facility] )     Physician recommends and patient chooses bed at      Patient to be transferred to  [facility] ) on 10/02/14.  Patient to be transferred to facility by  [facility] )     Patient family notified on 10/02/14 of transfer.  Name of family member notified:   (Patient's husband is at bedside. )     PHYSICIAN       Additional Comment:     _______________________________________________ Loralyn Freshwater, LCSW 10/02/2014, 10:17 AM

## 2014-10-05 ENCOUNTER — Encounter: Payer: Self-pay | Admitting: Internal Medicine

## 2014-10-05 ENCOUNTER — Non-Acute Institutional Stay (SKILLED_NURSING_FACILITY): Payer: 59 | Admitting: Internal Medicine

## 2014-10-05 DIAGNOSIS — M1712 Unilateral primary osteoarthritis, left knee: Secondary | ICD-10-CM | POA: Diagnosis not present

## 2014-10-05 DIAGNOSIS — E871 Hypo-osmolality and hyponatremia: Secondary | ICD-10-CM | POA: Diagnosis not present

## 2014-10-05 DIAGNOSIS — E785 Hyperlipidemia, unspecified: Secondary | ICD-10-CM

## 2014-10-05 DIAGNOSIS — D72829 Elevated white blood cell count, unspecified: Secondary | ICD-10-CM | POA: Diagnosis not present

## 2014-10-05 DIAGNOSIS — IMO0001 Reserved for inherently not codable concepts without codable children: Secondary | ICD-10-CM

## 2014-10-05 DIAGNOSIS — K5900 Constipation, unspecified: Secondary | ICD-10-CM

## 2014-10-05 DIAGNOSIS — T814XXA Infection following a procedure, initial encounter: Secondary | ICD-10-CM

## 2014-10-05 NOTE — Progress Notes (Signed)
Patient ID: Linda Rios, female   DOB: [birth year], 64 y.o.   MRN: [id number]     [facility]  PCP: Leonides Sake, MD  Code Status: Full Code   Allergies  Allergen Reactions  . Pravastatin Other (See Comments)    Reaction:  Headaches   . Vancomycin Hives and Itching    Patient with redness, hives, and itching to her scalp.    Chief Complaint  Patient presents with  . New Admit To SNF    New Admission      HPI:  64 y.o. patient is here for short term rehabilitation post hospital admission from 09/29/14-10/01/14 with left total knee arthroplasty for left knee osteoarthritis. She is seen in her room today. Her pain is overall under control with current pain regimen. She has noticed increased redness and swelling on her left knee with discomfort. She noticed a blister around dressing area yesterday which opened up and drained and has noticed a new blister today around the same area. She does not want to take her niacin while in facility  Review of Systems:  Constitutional: Negative for fever, chills, diaphoresis.  HENT: Negative for headache, congestion, nasal discharge Eyes: Negative for eye pain, blurred vision, double vision and discharge.  Respiratory: Negative for cough, shortness of breath and wheezing.   Cardiovascular: Negative for chest pain, palpitations. Positive for leg swelling.  Gastrointestinal: Negative for heartburn, nausea, vomiting, abdominal pain. Had bowel movement today after almost a week and feels good. Genitourinary: Negative for dysuria, flank pain.  Musculoskeletal: Negative for back pain, falls Skin: Negative for itching, rash.  Neurological: Negative for dizziness, tingling, focal weakness Psychiatric/Behavioral: Negative for depression.    Past Medical History  Diagnosis Date  . Arthritis   . Hyperlipidemia   . Diverticulosis    Past Surgical History  Procedure Laterality Date  . Knee arthroscopy Left   . Appendectomy      . Cholecystectomy    . Foot surgery Right   . Total knee arthroplasty Left 09/29/2014    Procedure: TOTAL KNEE ARTHROPLASTY;  Surgeon: Leanor Kail, MD;  Location: [facility] ORS;  Service: Orthopedics;  Laterality: Left;   Social History:   reports that she has never smoked. She has never used smokeless tobacco. She reports that she does not drink alcohol or use illicit drugs.  Family History  Problem Relation Age of Onset  . Diabetes Mother   . Hypertension Mother     Medications:   Medication List       This list is accurate as of: 10/05/14  9:14 AM.  Always use your most recent med list.               acetaminophen 500 MG tablet  Commonly known as:  TYLENOL  Take 500 mg by mouth every 4 (four) hours as needed for mild pain (For Mild Pain).     CALCIUM 600+D 600-800 MG-UNIT Tabs  Generic drug:  Calcium Carb-Cholecalciferol  Take 1 tablet by mouth daily.     diphenhydramine-acetaminophen 25-500 MG Tabs  Commonly known as:  TYLENOL PM  Take 2 tablets by mouth at bedtime as needed (for sleep).     enoxaparin 40 MG/0.4ML injection  Commonly known as:  LOVENOX  Inject 0.4 mLs (40 mg total) into the skin daily.     GLUCOSAMINE CHONDR 1500 COMPLX PO  Take 1 tablet by mouth 2 (two) times daily.     lovastatin 20 MG tablet  Commonly known as:  MEVACOR  Take 20 mg by mouth at bedtime.     meloxicam 7.5 MG tablet  Commonly known as:  MOBIC  Take 7.5 mg by mouth daily.     niacin 500 MG tablet  Take 500 mg by mouth 2 (two) times daily.     ONE DAILY FOR WOMEN Tabs  Take 1 tablet by mouth daily.     Oxycodone HCl 10 MG Tabs  1 by mouth every 4 hours as needed for moderate pain, 1 1/2 by mouth every 4 hours as needed for severe pain         Physical Exam: Filed Vitals:   10/05/14 0907  BP: 146/85  Pulse: 90  Temp: 97.7 F (36.5 C)  TempSrc: Oral  Resp: 20  SpO2: 95%    General- elderly female, in no acute distress Head- normocephalic,  atraumatic Throat- moist mucus membrane Eyes- PERRLA, EOMI, no pallor, no icterus, no discharge, normal conjunctiva, normal sclera Neck- no cervical lymphadenopathy, no jugular vein distension Cardiovascular- normal s1,s2, no murmurs, palpable dorsalis pedis and radial pulses, trace leg edema Respiratory- bilateral clear to auscultation, no wheeze, no rhonchi, no crackles, no use of accessory muscles Abdomen- bowel sounds present, soft, non tender Musculoskeletal- able to move all 4 extremities, left knee swollen with erythema and warmth present and bruising present, left leg ROM restricted at the knee, no calf tenderness Neurological- no focal deficit, alert and oriented to person, place and time Skin- warm and dry, left knee aquacel dressing with drainage noted, erythema around the dressing site and medial area of the left knee Psychiatry- normal mood and affect    Labs reviewed: Basic Metabolic Panel:  Recent Labs  09/30/14 1933 10/01/14 0601 10/02/14 0429  NA 134* 133* 132*  K 4.3 4.1 4.7  CL 97* 97* 97*  CO2 30 28 30   GLUCOSE 123* 130* 125*  BUN 15 11 10   CREATININE 0.95 0.76 0.77  CALCIUM 8.5* 8.4* 8.3*   Liver Function Tests: No results for input(s): AST, ALT, ALKPHOS, BILITOT, PROT, ALBUMIN in the last 8760 hours. No results for input(s): LIPASE, AMYLASE in the last 8760 hours. No results for input(s): AMMONIA in the last 8760 hours. CBC:  Recent Labs  09/29/14 1353 10/01/14 0601 10/02/14 0429  WBC 10.1 14.7* 14.3*  HGB 13.8 12.4 12.0  HCT 41.8 36.9 36.2  MCV 87.2 86.4 86.5  PLT 216 214 201    Assessment/Plan  Left knee OA S/p left TKA. Has follow up with orthopedics. Will have her work with physical therapy and occupational therapy team to help with gait training and muscle strengthening exercises.fall precautions. Skin care. Encourage to be out of bed. Continue oxycodone 10 mg 1-11/2 tab q4h prn pain. Continue lovenox for dvt prophylaxis. Continue calcium  vitamin d and mobic. WBAT.  Left knee erythema With swelling and warmth. Drainage noted on the dressing. Nursing supervisor to call orthopedic office to schedule appointment to see patient ASAP or to remove dressing for further assessment. Start her empirically on doxycycline 100 mg bid until seen by orthpedics  Hyperlipidemia Continue mevacor home regimen. D/c niacin per pt request  Hyponatremia Alert and oriented, likely post op, recheck bmp  Leukocytosis Possible infection of incision site vs reactive leukocytosis. To call for permission to remove dressing to evaluate further, check cbc in am and starting antibiotic empirically for surgical site infection.  Constipation Add colace 100 mg bid and miralax daily x 3 days, f/b daily prn, hydration encouraged, monitor  Goals of  care: short term rehabilitation   Labs/tests ordered: cbc with diff, bmp  Family/ staff Communication: reviewed care plan with patient and nursing supervisor    Blanchie Serve, MD  [facility] [phone] (Monday-Friday 8 am - 5 pm) [phone] (afterhours)

## 2014-10-11 DIAGNOSIS — Z471 Aftercare following joint replacement surgery: Secondary | ICD-10-CM | POA: Diagnosis not present

## 2014-10-11 DIAGNOSIS — R278 Other lack of coordination: Secondary | ICD-10-CM | POA: Diagnosis not present

## 2014-10-11 DIAGNOSIS — R2681 Unsteadiness on feet: Secondary | ICD-10-CM | POA: Diagnosis not present

## 2014-10-11 DIAGNOSIS — Z96652 Presence of left artificial knee joint: Secondary | ICD-10-CM | POA: Diagnosis not present

## 2014-10-11 DIAGNOSIS — K5901 Slow transit constipation: Secondary | ICD-10-CM | POA: Diagnosis not present

## 2014-10-11 DIAGNOSIS — M6281 Muscle weakness (generalized): Secondary | ICD-10-CM | POA: Diagnosis not present

## 2014-10-13 ENCOUNTER — Non-Acute Institutional Stay (SKILLED_NURSING_FACILITY): Payer: 59 | Admitting: Internal Medicine

## 2014-10-13 DIAGNOSIS — K5901 Slow transit constipation: Secondary | ICD-10-CM

## 2014-10-13 DIAGNOSIS — Z96652 Presence of left artificial knee joint: Secondary | ICD-10-CM | POA: Diagnosis not present

## 2014-10-13 NOTE — Progress Notes (Signed)
Patient ID: Linda Rios, female   DOB: July 09, 1950, 64 y.o.   MRN: [id number]   Patient ID: Linda Rios, female   DOB: [birth year], 64 y.o.   MRN: [id number]     [facility]  PCP: Leonides Sake, MD  Code Status: Full Code   Allergies  Allergen Reactions  . Pravastatin Other (See Comments)    Reaction:  Headaches   . Vancomycin Hives and Itching    Patient with redness, hives, and itching to her scalp.    Chief Complaint  Patient presents with  . Discharge Note     HPI:  64 y.o. patient is seen for discharge visit. She is sitting on her bed, in no distress and denies any concern. her pain medication has been helping her. Her bowel movement is regular. She has been working with therapy team and is able to wlk unassisted with a walker around the facility. No falls reported. To see orthopedic tomorrow. She is here for short term rehabilitation post hospital admission from 09/29/14-10/01/14 with left total knee arthroplasty for left knee osteoarthritis.   Review of Systems:  Constitutional: Negative for fever, chills, diaphoresis.  Respiratory: Negative for cough, shortness of breath and wheezing.   Cardiovascular: Negative for chest pain, palpitations. Positive for leg swelling.  Gastrointestinal: Negative for heartburn, nausea, vomiting, abdominal pain. Had bowel movement today Genitourinary: Negative for dysuria, flank pain.  Musculoskeletal: Negative for back pain, falls Skin: Negative for itching, rash.  Neurological: Negative for dizziness, tingling, focal weakness Psychiatric/Behavioral: Negative for depression.    Past Medical History  Diagnosis Date  . Arthritis   . Hyperlipidemia   . Diverticulosis     Medications:   Medication List       This list is accurate as of: 10/13/14  2:23 PM.  Always use your most recent med list.               acetaminophen 500 MG tablet  Commonly known as:  TYLENOL  Take 500 mg by mouth every 4 (four)  hours as needed for mild pain (For Mild Pain).     CALCIUM 600+D 600-800 MG-UNIT Tabs  Generic drug:  Calcium Carb-Cholecalciferol  Take 1 tablet by mouth daily.     diphenhydramine-acetaminophen 25-500 MG Tabs  Commonly known as:  TYLENOL PM  Take 2 tablets by mouth at bedtime as needed (for sleep).     enoxaparin 40 MG/0.4ML injection  Commonly known as:  LOVENOX  Inject 0.4 mLs (40 mg total) into the skin daily.     GLUCOSAMINE CHONDR 1500 COMPLX PO  Take 1 tablet by mouth 2 (two) times daily.     lovastatin 20 MG tablet  Commonly known as:  MEVACOR  Take 20 mg by mouth at bedtime.     meloxicam 7.5 MG tablet  Commonly known as:  MOBIC  Take 7.5 mg by mouth daily.     ONE DAILY FOR WOMEN Tabs  Take 1 tablet by mouth daily.     Oxycodone HCl 10 MG Tabs  1 by mouth every 4 hours as needed for moderate pain, 1 1/2 by mouth every 4 hours as needed for severe pain     polyethylene glycol packet  Commonly known as:  MIRALAX / GLYCOLAX  Take 17 g by mouth daily as needed.         Physical Exam: Filed Vitals:   10/13/14 1421  BP: 124/77  Pulse: 77  Temp: 97.5 F (36.4 C)  Resp: 18  SpO2: 98%    General- elderly female, obese, in no acute distress Head- normocephalic, atraumatic Throat- moist mucus membrane Eyes- PERRLA, EOMI, no pallor, no icterus, no discharge, normal conjunctiva, normal sclera Neck- no cervical lymphadenopathy, no jugular vein distension Cardiovascular- normal s1,s2, no murmurs, palpable dorsalis pedis and radial pulses, trace right leg and left 1+  leg edema Respiratory- bilateral clear to auscultation, no wheeze, no rhonchi, no crackles, no use of accessory muscles Abdomen- bowel sounds present, soft, non tender Musculoskeletal- able to move all 4 extremities, ted hose in place Neurological- no focal deficit, alert and oriented to person, place and time Skin- warm and dry, left knee dressing in place and clean and dry Psychiatry- normal  mood and affect    Labs reviewed: Basic Metabolic Panel:  Recent Labs  09/30/14 1933 10/01/14 0601 10/02/14 0429  NA 134* 133* 132*  K 4.3 4.1 4.7  CL 97* 97* 97*  CO2 30 28 30   GLUCOSE 123* 130* 125*  BUN 15 11 10   CREATININE 0.95 0.76 0.77  CALCIUM 8.5* 8.4* 8.3*   Liver Function Tests: No results for input(s): AST, ALT, ALKPHOS, BILITOT, PROT, ALBUMIN in the last 8760 hours. No results for input(s): LIPASE, AMYLASE in the last 8760 hours. No results for input(s): AMMONIA in the last 8760 hours. CBC:  Recent Labs  09/29/14 1353 10/01/14 0601 10/02/14 0429  WBC 10.1 14.7* 14.3*  HGB 13.8 12.4 12.0  HCT 41.8 36.9 36.2  MCV 87.2 86.4 86.5  PLT 216 214 201    Assessment/Plan  Patient is here post Left TKA for left knee OA. She has worked with therapy team here, made improvement and now is stable to be discharged home with home health services for PT and OT. She has a walker and cane at home and does not need any DME. We have recommended a single point cane and shower bench but pt has refused this. To see orthopedic services tomorrow. Will complete her 14 days of lovenox on 10/15/14. Continue her bowel regimen and pain medication on as needed basis  Reviewed discharge plan with patient and her husband. Reviewed care plan with social worker and charge nurse. Script for her medication will be provided.    Blanchie Serve, MD  [facility]  [phone] (Monday-Friday 8 am - 5 pm) [phone] (afterhours)

## 2014-10-14 DIAGNOSIS — Z96652 Presence of left artificial knee joint: Secondary | ICD-10-CM | POA: Diagnosis not present

## 2014-10-18 DIAGNOSIS — Z96652 Presence of left artificial knee joint: Secondary | ICD-10-CM | POA: Diagnosis not present

## 2014-10-18 DIAGNOSIS — Z471 Aftercare following joint replacement surgery: Secondary | ICD-10-CM | POA: Diagnosis not present

## 2014-10-29 DIAGNOSIS — M25562 Pain in left knee: Secondary | ICD-10-CM | POA: Diagnosis not present

## 2014-10-29 DIAGNOSIS — Z6841 Body Mass Index (BMI) 40.0 and over, adult: Secondary | ICD-10-CM | POA: Diagnosis not present

## 2014-10-29 DIAGNOSIS — Z1389 Encounter for screening for other disorder: Secondary | ICD-10-CM | POA: Diagnosis not present

## 2014-10-29 DIAGNOSIS — E782 Mixed hyperlipidemia: Secondary | ICD-10-CM | POA: Diagnosis not present

## 2014-10-29 DIAGNOSIS — Z9181 History of falling: Secondary | ICD-10-CM | POA: Diagnosis not present

## 2014-10-31 DIAGNOSIS — M25562 Pain in left knee: Secondary | ICD-10-CM | POA: Diagnosis not present

## 2014-11-16 DIAGNOSIS — Z96652 Presence of left artificial knee joint: Secondary | ICD-10-CM | POA: Diagnosis not present

## 2015-01-17 DIAGNOSIS — Z79899 Other long term (current) drug therapy: Secondary | ICD-10-CM | POA: Diagnosis not present

## 2015-01-17 DIAGNOSIS — E782 Mixed hyperlipidemia: Secondary | ICD-10-CM | POA: Diagnosis not present

## 2015-01-21 DIAGNOSIS — Z6841 Body Mass Index (BMI) 40.0 and over, adult: Secondary | ICD-10-CM | POA: Diagnosis not present

## 2015-01-21 DIAGNOSIS — E782 Mixed hyperlipidemia: Secondary | ICD-10-CM | POA: Diagnosis not present

## 2015-02-15 DIAGNOSIS — Z96652 Presence of left artificial knee joint: Secondary | ICD-10-CM | POA: Diagnosis not present

## 2015-02-15 DIAGNOSIS — M25562 Pain in left knee: Secondary | ICD-10-CM | POA: Diagnosis not present

## 2015-04-08 LAB — TYPE AND SCREEN
ABO/RH(D): A POS
Antibody Screen: NEGATIVE
UNIT DIVISION: 0
Unit division: 0

## 2015-05-11 DIAGNOSIS — Z1231 Encounter for screening mammogram for malignant neoplasm of breast: Secondary | ICD-10-CM | POA: Diagnosis not present

## 2015-07-18 DIAGNOSIS — Z79899 Other long term (current) drug therapy: Secondary | ICD-10-CM | POA: Diagnosis not present

## 2015-07-18 DIAGNOSIS — E782 Mixed hyperlipidemia: Secondary | ICD-10-CM | POA: Diagnosis not present

## 2015-07-22 DIAGNOSIS — E782 Mixed hyperlipidemia: Secondary | ICD-10-CM | POA: Diagnosis not present

## 2015-07-22 DIAGNOSIS — L989 Disorder of the skin and subcutaneous tissue, unspecified: Secondary | ICD-10-CM | POA: Diagnosis not present

## 2016-02-13 DIAGNOSIS — Z9181 History of falling: Secondary | ICD-10-CM | POA: Diagnosis not present

## 2016-02-13 DIAGNOSIS — Z1389 Encounter for screening for other disorder: Secondary | ICD-10-CM | POA: Diagnosis not present

## 2016-02-13 DIAGNOSIS — E875 Hyperkalemia: Secondary | ICD-10-CM | POA: Diagnosis not present

## 2016-02-13 DIAGNOSIS — Z0000 Encounter for general adult medical examination without abnormal findings: Secondary | ICD-10-CM | POA: Diagnosis not present

## 2016-03-08 DIAGNOSIS — H2513 Age-related nuclear cataract, bilateral: Secondary | ICD-10-CM | POA: Diagnosis not present

## 2016-03-08 DIAGNOSIS — H40033 Anatomical narrow angle, bilateral: Secondary | ICD-10-CM | POA: Diagnosis not present

## 2016-06-21 DIAGNOSIS — Z1231 Encounter for screening mammogram for malignant neoplasm of breast: Secondary | ICD-10-CM | POA: Diagnosis not present

## 2016-08-14 DIAGNOSIS — E782 Mixed hyperlipidemia: Secondary | ICD-10-CM | POA: Diagnosis not present

## 2016-08-14 DIAGNOSIS — R350 Frequency of micturition: Secondary | ICD-10-CM | POA: Diagnosis not present

## 2016-08-21 ENCOUNTER — Other Ambulatory Visit: Payer: Self-pay | Admitting: Nurse Practitioner

## 2016-08-21 DIAGNOSIS — N959 Unspecified menopausal and perimenopausal disorder: Secondary | ICD-10-CM | POA: Diagnosis not present

## 2016-08-21 DIAGNOSIS — Z0000 Encounter for general adult medical examination without abnormal findings: Secondary | ICD-10-CM | POA: Diagnosis not present

## 2016-08-21 DIAGNOSIS — Z9181 History of falling: Secondary | ICD-10-CM | POA: Diagnosis not present

## 2016-08-21 DIAGNOSIS — Z780 Asymptomatic menopausal state: Secondary | ICD-10-CM

## 2016-08-21 DIAGNOSIS — Z136 Encounter for screening for cardiovascular disorders: Secondary | ICD-10-CM | POA: Diagnosis not present

## 2016-08-21 DIAGNOSIS — E785 Hyperlipidemia, unspecified: Secondary | ICD-10-CM | POA: Diagnosis not present

## 2016-08-21 DIAGNOSIS — Z1389 Encounter for screening for other disorder: Secondary | ICD-10-CM | POA: Diagnosis not present

## 2016-10-09 ENCOUNTER — Ambulatory Visit: Payer: Self-pay

## 2016-10-10 ENCOUNTER — Ambulatory Visit
Admission: RE | Admit: 2016-10-10 | Discharge: 2016-10-10 | Disposition: A | Discharge status: Home / Self Care | Payer: Medicare Other | Source: Ambulatory Visit | Attending: Nurse Practitioner | Admitting: Nurse Practitioner

## 2016-10-10 DIAGNOSIS — M8588 Other specified disorders of bone density and structure, other site: Secondary | ICD-10-CM | POA: Diagnosis not present

## 2016-10-10 DIAGNOSIS — M85852 Other specified disorders of bone density and structure, left thigh: Secondary | ICD-10-CM | POA: Insufficient documentation

## 2016-10-10 DIAGNOSIS — Z780 Asymptomatic menopausal state: Secondary | ICD-10-CM | POA: Diagnosis not present

## 2016-10-10 DIAGNOSIS — M8589 Other specified disorders of bone density and structure, multiple sites: Secondary | ICD-10-CM | POA: Diagnosis not present

## 2017-02-21 DIAGNOSIS — Z23 Encounter for immunization: Secondary | ICD-10-CM | POA: Diagnosis not present

## 2017-02-21 DIAGNOSIS — R350 Frequency of micturition: Secondary | ICD-10-CM | POA: Diagnosis not present

## 2017-02-21 DIAGNOSIS — E782 Mixed hyperlipidemia: Secondary | ICD-10-CM | POA: Diagnosis not present

## 2017-02-21 DIAGNOSIS — I10 Essential (primary) hypertension: Secondary | ICD-10-CM | POA: Diagnosis not present

## 2017-03-25 DIAGNOSIS — R1900 Intra-abdominal and pelvic swelling, mass and lump, unspecified site: Secondary | ICD-10-CM | POA: Diagnosis not present

## 2017-03-25 DIAGNOSIS — Z6841 Body Mass Index (BMI) 40.0 and over, adult: Secondary | ICD-10-CM | POA: Diagnosis not present

## 2017-03-25 DIAGNOSIS — I10 Essential (primary) hypertension: Secondary | ICD-10-CM | POA: Diagnosis not present

## 2017-03-27 ENCOUNTER — Other Ambulatory Visit: Payer: Self-pay | Admitting: Gastroenterology

## 2017-03-27 ENCOUNTER — Ambulatory Visit
Admission: RE | Admit: 2017-03-27 | Discharge: 2017-03-27 | Disposition: A | Discharge status: Home / Self Care | Payer: Medicare HMO | Source: Ambulatory Visit | Attending: Gastroenterology | Admitting: Gastroenterology

## 2017-03-27 DIAGNOSIS — R1900 Intra-abdominal and pelvic swelling, mass and lump, unspecified site: Secondary | ICD-10-CM

## 2017-03-27 DIAGNOSIS — I700 Atherosclerosis of aorta: Secondary | ICD-10-CM | POA: Diagnosis not present

## 2017-03-27 DIAGNOSIS — R1010 Upper abdominal pain, unspecified: Secondary | ICD-10-CM | POA: Diagnosis not present

## 2017-03-27 DIAGNOSIS — Z9049 Acquired absence of other specified parts of digestive tract: Secondary | ICD-10-CM | POA: Insufficient documentation

## 2017-03-27 DIAGNOSIS — Z6841 Body Mass Index (BMI) 40.0 and over, adult: Secondary | ICD-10-CM | POA: Diagnosis not present

## 2017-03-27 DIAGNOSIS — R109 Unspecified abdominal pain: Secondary | ICD-10-CM | POA: Diagnosis not present

## 2017-03-27 HISTORY — DX: Essential (primary) hypertension: I10

## 2017-03-27 LAB — POCT I-STAT CREATININE: Creatinine, Ser: 0.8 mg/dL (ref 0.44–1.00)

## 2017-03-27 MED ORDER — IOPAMIDOL (ISOVUE-370) INJECTION 76%
100.0000 mL | Freq: Once | INTRAVENOUS | Status: AC | PRN
  Administered 2017-03-27: 100 mL via INTRAVENOUS

## 2017-04-11 DIAGNOSIS — R1906 Epigastric swelling, mass or lump: Secondary | ICD-10-CM | POA: Diagnosis not present

## 2017-04-11 DIAGNOSIS — M940 Chondrocostal junction syndrome [Tietze]: Secondary | ICD-10-CM | POA: Diagnosis not present

## 2017-04-19 ENCOUNTER — Other Ambulatory Visit: Payer: Self-pay | Admitting: Gastroenterology

## 2017-04-19 ENCOUNTER — Ambulatory Visit
Admission: RE | Admit: 2017-04-19 | Discharge: 2017-04-19 | Disposition: A | Discharge status: Home / Self Care | Payer: Medicare HMO | Source: Ambulatory Visit | Attending: Gastroenterology | Admitting: Gastroenterology

## 2017-04-19 DIAGNOSIS — M940 Chondrocostal junction syndrome [Tietze]: Secondary | ICD-10-CM | POA: Diagnosis not present

## 2017-04-19 DIAGNOSIS — R1906 Epigastric swelling, mass or lump: Secondary | ICD-10-CM

## 2017-04-19 DIAGNOSIS — R229 Localized swelling, mass and lump, unspecified: Secondary | ICD-10-CM | POA: Diagnosis not present

## 2017-04-19 DIAGNOSIS — R1013 Epigastric pain: Secondary | ICD-10-CM | POA: Diagnosis not present

## 2017-04-25 DIAGNOSIS — Z139 Encounter for screening, unspecified: Secondary | ICD-10-CM | POA: Diagnosis not present

## 2017-04-25 DIAGNOSIS — E782 Mixed hyperlipidemia: Secondary | ICD-10-CM | POA: Diagnosis not present

## 2017-04-25 DIAGNOSIS — I10 Essential (primary) hypertension: Secondary | ICD-10-CM | POA: Diagnosis not present

## 2017-05-01 DIAGNOSIS — D171 Benign lipomatous neoplasm of skin and subcutaneous tissue of trunk: Secondary | ICD-10-CM | POA: Diagnosis not present

## 2017-06-22 DIAGNOSIS — Z1231 Encounter for screening mammogram for malignant neoplasm of breast: Secondary | ICD-10-CM | POA: Diagnosis not present

## 2017-06-27 DIAGNOSIS — N6489 Other specified disorders of breast: Secondary | ICD-10-CM | POA: Diagnosis not present

## 2017-08-22 DIAGNOSIS — Z9181 History of falling: Secondary | ICD-10-CM | POA: Diagnosis not present

## 2017-08-22 DIAGNOSIS — Z136 Encounter for screening for cardiovascular disorders: Secondary | ICD-10-CM | POA: Diagnosis not present

## 2017-08-22 DIAGNOSIS — Z0000 Encounter for general adult medical examination without abnormal findings: Secondary | ICD-10-CM | POA: Diagnosis not present

## 2017-08-22 DIAGNOSIS — Z1331 Encounter for screening for depression: Secondary | ICD-10-CM | POA: Diagnosis not present

## 2017-08-22 DIAGNOSIS — E785 Hyperlipidemia, unspecified: Secondary | ICD-10-CM | POA: Diagnosis not present

## 2017-08-22 DIAGNOSIS — E669 Obesity, unspecified: Secondary | ICD-10-CM | POA: Diagnosis not present

## 2017-08-22 DIAGNOSIS — Z6841 Body Mass Index (BMI) 40.0 and over, adult: Secondary | ICD-10-CM | POA: Diagnosis not present

## 2017-08-22 DIAGNOSIS — Z139 Encounter for screening, unspecified: Secondary | ICD-10-CM | POA: Diagnosis not present

## 2017-09-24 DIAGNOSIS — I10 Essential (primary) hypertension: Secondary | ICD-10-CM | POA: Diagnosis not present

## 2017-09-24 DIAGNOSIS — Z1339 Encounter for screening examination for other mental health and behavioral disorders: Secondary | ICD-10-CM | POA: Diagnosis not present

## 2017-09-24 DIAGNOSIS — E782 Mixed hyperlipidemia: Secondary | ICD-10-CM | POA: Diagnosis not present

## 2017-09-24 DIAGNOSIS — R350 Frequency of micturition: Secondary | ICD-10-CM | POA: Diagnosis not present

## 2017-09-24 DIAGNOSIS — Z1331 Encounter for screening for depression: Secondary | ICD-10-CM | POA: Diagnosis not present

## 2017-09-24 DIAGNOSIS — Z6841 Body Mass Index (BMI) 40.0 and over, adult: Secondary | ICD-10-CM | POA: Diagnosis not present

## 2017-12-09 DIAGNOSIS — H524 Presbyopia: Secondary | ICD-10-CM | POA: Diagnosis not present

## 2017-12-09 DIAGNOSIS — Z0100 Encounter for examination of eyes and vision without abnormal findings: Secondary | ICD-10-CM | POA: Diagnosis not present

## 2017-12-12 DIAGNOSIS — R921 Mammographic calcification found on diagnostic imaging of breast: Secondary | ICD-10-CM | POA: Diagnosis not present

## 2018-03-27 DIAGNOSIS — I10 Essential (primary) hypertension: Secondary | ICD-10-CM | POA: Diagnosis not present

## 2018-03-27 DIAGNOSIS — R350 Frequency of micturition: Secondary | ICD-10-CM | POA: Diagnosis not present

## 2018-03-27 DIAGNOSIS — E782 Mixed hyperlipidemia: Secondary | ICD-10-CM | POA: Diagnosis not present

## 2018-03-27 DIAGNOSIS — Z6841 Body Mass Index (BMI) 40.0 and over, adult: Secondary | ICD-10-CM | POA: Diagnosis not present

## 2018-06-26 DIAGNOSIS — M545 Low back pain: Secondary | ICD-10-CM | POA: Diagnosis not present

## 2018-06-26 DIAGNOSIS — Z6841 Body Mass Index (BMI) 40.0 and over, adult: Secondary | ICD-10-CM | POA: Diagnosis not present

## 2018-07-24 DIAGNOSIS — R05 Cough: Secondary | ICD-10-CM | POA: Diagnosis not present

## 2018-07-24 DIAGNOSIS — I10 Essential (primary) hypertension: Secondary | ICD-10-CM | POA: Diagnosis not present

## 2018-07-31 DIAGNOSIS — R05 Cough: Secondary | ICD-10-CM | POA: Diagnosis not present

## 2018-07-31 DIAGNOSIS — Z6841 Body Mass Index (BMI) 40.0 and over, adult: Secondary | ICD-10-CM | POA: Diagnosis not present

## 2018-08-14 DIAGNOSIS — F458 Other somatoform disorders: Secondary | ICD-10-CM | POA: Diagnosis not present

## 2018-08-14 DIAGNOSIS — R221 Localized swelling, mass and lump, neck: Secondary | ICD-10-CM | POA: Diagnosis not present

## 2018-08-14 DIAGNOSIS — K219 Gastro-esophageal reflux disease without esophagitis: Secondary | ICD-10-CM | POA: Diagnosis not present

## 2018-08-28 DIAGNOSIS — Z139 Encounter for screening, unspecified: Secondary | ICD-10-CM | POA: Diagnosis not present

## 2018-08-28 DIAGNOSIS — E785 Hyperlipidemia, unspecified: Secondary | ICD-10-CM | POA: Diagnosis not present

## 2018-08-28 DIAGNOSIS — Z9181 History of falling: Secondary | ICD-10-CM | POA: Diagnosis not present

## 2018-08-28 DIAGNOSIS — Z1331 Encounter for screening for depression: Secondary | ICD-10-CM | POA: Diagnosis not present

## 2018-08-28 DIAGNOSIS — Z0000 Encounter for general adult medical examination without abnormal findings: Secondary | ICD-10-CM | POA: Diagnosis not present

## 2018-08-28 DIAGNOSIS — Z6841 Body Mass Index (BMI) 40.0 and over, adult: Secondary | ICD-10-CM | POA: Diagnosis not present

## 2018-09-11 DIAGNOSIS — Z1231 Encounter for screening mammogram for malignant neoplasm of breast: Secondary | ICD-10-CM | POA: Diagnosis not present

## 2018-09-18 DIAGNOSIS — Z86010 Personal history of colonic polyps: Secondary | ICD-10-CM | POA: Diagnosis not present

## 2018-09-18 DIAGNOSIS — I10 Essential (primary) hypertension: Secondary | ICD-10-CM | POA: Diagnosis not present

## 2018-09-18 DIAGNOSIS — Z6841 Body Mass Index (BMI) 40.0 and over, adult: Secondary | ICD-10-CM | POA: Diagnosis not present

## 2018-09-18 DIAGNOSIS — R0989 Other specified symptoms and signs involving the circulatory and respiratory systems: Secondary | ICD-10-CM | POA: Diagnosis not present

## 2018-09-25 DIAGNOSIS — F458 Other somatoform disorders: Secondary | ICD-10-CM | POA: Diagnosis not present

## 2018-09-25 DIAGNOSIS — K219 Gastro-esophageal reflux disease without esophagitis: Secondary | ICD-10-CM | POA: Diagnosis not present

## 2018-10-01 DIAGNOSIS — R0989 Other specified symptoms and signs involving the circulatory and respiratory systems: Secondary | ICD-10-CM | POA: Diagnosis not present

## 2018-10-01 DIAGNOSIS — R350 Frequency of micturition: Secondary | ICD-10-CM | POA: Diagnosis not present

## 2018-10-01 DIAGNOSIS — Z6841 Body Mass Index (BMI) 40.0 and over, adult: Secondary | ICD-10-CM | POA: Diagnosis not present

## 2018-10-01 DIAGNOSIS — I10 Essential (primary) hypertension: Secondary | ICD-10-CM | POA: Diagnosis not present

## 2018-10-01 DIAGNOSIS — E782 Mixed hyperlipidemia: Secondary | ICD-10-CM | POA: Diagnosis not present

## 2018-10-01 DIAGNOSIS — Z79899 Other long term (current) drug therapy: Secondary | ICD-10-CM | POA: Diagnosis not present

## 2018-10-09 ENCOUNTER — Other Ambulatory Visit: Payer: Self-pay | Admitting: Gastroenterology

## 2018-10-09 DIAGNOSIS — R1314 Dysphagia, pharyngoesophageal phase: Secondary | ICD-10-CM

## 2018-10-09 DIAGNOSIS — Z86010 Personal history of colonic polyps: Secondary | ICD-10-CM | POA: Diagnosis not present

## 2018-10-14 ENCOUNTER — Ambulatory Visit
Admission: RE | Admit: 2018-10-14 | Discharge: 2018-10-14 | Disposition: A | Discharge status: Home / Self Care | Payer: Medicare HMO | Source: Ambulatory Visit | Attending: Gastroenterology | Admitting: Gastroenterology

## 2018-10-14 ENCOUNTER — Other Ambulatory Visit: Payer: Self-pay

## 2018-10-14 DIAGNOSIS — R1314 Dysphagia, pharyngoesophageal phase: Secondary | ICD-10-CM | POA: Insufficient documentation

## 2018-10-14 DIAGNOSIS — J392 Other diseases of pharynx: Secondary | ICD-10-CM | POA: Diagnosis not present

## 2018-10-17 ENCOUNTER — Other Ambulatory Visit: Payer: Self-pay | Admitting: Gastroenterology

## 2018-10-17 DIAGNOSIS — R221 Localized swelling, mass and lump, neck: Secondary | ICD-10-CM

## 2018-10-24 ENCOUNTER — Ambulatory Visit
Admission: RE | Admit: 2018-10-24 | Discharge: 2018-10-24 | Disposition: A | Discharge status: Home / Self Care | Payer: Medicare HMO | Source: Ambulatory Visit | Attending: Gastroenterology | Admitting: Gastroenterology

## 2018-10-24 ENCOUNTER — Other Ambulatory Visit: Payer: Self-pay

## 2018-10-24 DIAGNOSIS — R221 Localized swelling, mass and lump, neck: Secondary | ICD-10-CM | POA: Insufficient documentation

## 2018-10-24 DIAGNOSIS — R0989 Other specified symptoms and signs involving the circulatory and respiratory systems: Secondary | ICD-10-CM | POA: Diagnosis not present

## 2018-10-24 LAB — POCT I-STAT CREATININE: Creatinine, Ser: 0.8 mg/dL (ref 0.44–1.00)

## 2018-10-24 MED ORDER — IOHEXOL 300 MG/ML  SOLN
75.0000 mL | Freq: Once | INTRAMUSCULAR | Status: AC | PRN
  Administered 2018-10-24: 75 mL via INTRAVENOUS

## 2018-11-05 DIAGNOSIS — E049 Nontoxic goiter, unspecified: Secondary | ICD-10-CM | POA: Diagnosis not present

## 2018-11-05 DIAGNOSIS — Z6841 Body Mass Index (BMI) 40.0 and over, adult: Secondary | ICD-10-CM | POA: Diagnosis not present

## 2018-11-05 DIAGNOSIS — R1314 Dysphagia, pharyngoesophageal phase: Secondary | ICD-10-CM | POA: Diagnosis not present

## 2018-11-06 ENCOUNTER — Other Ambulatory Visit: Payer: Self-pay

## 2018-11-06 ENCOUNTER — Other Ambulatory Visit
Admission: RE | Admit: 2018-11-06 | Discharge: 2018-11-06 | Disposition: A | Discharge status: Home / Self Care | Payer: Medicare HMO | Source: Ambulatory Visit | Attending: Gastroenterology | Admitting: Gastroenterology

## 2018-11-06 DIAGNOSIS — Z01812 Encounter for preprocedural laboratory examination: Secondary | ICD-10-CM | POA: Insufficient documentation

## 2018-11-06 DIAGNOSIS — Z20828 Contact with and (suspected) exposure to other viral communicable diseases: Secondary | ICD-10-CM | POA: Insufficient documentation

## 2018-11-06 LAB — SARS CORONAVIRUS 2 (TAT 6-24 HRS): SARS Coronavirus 2: NEGATIVE

## 2018-11-07 ENCOUNTER — Encounter: Payer: Self-pay | Admitting: *Deleted

## 2018-11-10 ENCOUNTER — Ambulatory Visit: Payer: Medicare HMO | Admitting: Anesthesiology

## 2018-11-10 ENCOUNTER — Other Ambulatory Visit: Payer: Self-pay

## 2018-11-10 ENCOUNTER — Encounter: Payer: Self-pay | Admitting: *Deleted

## 2018-11-10 ENCOUNTER — Encounter
Admission: RE | Disposition: A | Discharge status: Home / Self Care | Payer: Self-pay | Source: Home / Self Care | Attending: Gastroenterology

## 2018-11-10 ENCOUNTER — Ambulatory Visit
Admission: RE | Admit: 2018-11-10 | Discharge: 2018-11-10 | Disposition: A | Discharge status: Home / Self Care | Payer: Medicare HMO | Attending: Gastroenterology | Admitting: Gastroenterology

## 2018-11-10 DIAGNOSIS — R1310 Dysphagia, unspecified: Secondary | ICD-10-CM | POA: Diagnosis not present

## 2018-11-10 DIAGNOSIS — Z791 Long term (current) use of non-steroidal anti-inflammatories (NSAID): Secondary | ICD-10-CM | POA: Insufficient documentation

## 2018-11-10 DIAGNOSIS — K648 Other hemorrhoids: Secondary | ICD-10-CM | POA: Diagnosis not present

## 2018-11-10 DIAGNOSIS — K3189 Other diseases of stomach and duodenum: Secondary | ICD-10-CM | POA: Diagnosis not present

## 2018-11-10 DIAGNOSIS — K641 Second degree hemorrhoids: Secondary | ICD-10-CM | POA: Insufficient documentation

## 2018-11-10 DIAGNOSIS — K2950 Unspecified chronic gastritis without bleeding: Secondary | ICD-10-CM | POA: Insufficient documentation

## 2018-11-10 DIAGNOSIS — Z7901 Long term (current) use of anticoagulants: Secondary | ICD-10-CM | POA: Diagnosis not present

## 2018-11-10 DIAGNOSIS — Z96659 Presence of unspecified artificial knee joint: Secondary | ICD-10-CM | POA: Diagnosis not present

## 2018-11-10 DIAGNOSIS — K635 Polyp of colon: Secondary | ICD-10-CM | POA: Diagnosis not present

## 2018-11-10 DIAGNOSIS — Z7984 Long term (current) use of oral hypoglycemic drugs: Secondary | ICD-10-CM | POA: Insufficient documentation

## 2018-11-10 DIAGNOSIS — E785 Hyperlipidemia, unspecified: Secondary | ICD-10-CM | POA: Insufficient documentation

## 2018-11-10 DIAGNOSIS — K228 Other specified diseases of esophagus: Secondary | ICD-10-CM | POA: Diagnosis not present

## 2018-11-10 DIAGNOSIS — Z888 Allergy status to other drugs, medicaments and biological substances status: Secondary | ICD-10-CM | POA: Diagnosis not present

## 2018-11-10 DIAGNOSIS — Z79899 Other long term (current) drug therapy: Secondary | ICD-10-CM | POA: Diagnosis not present

## 2018-11-10 DIAGNOSIS — I10 Essential (primary) hypertension: Secondary | ICD-10-CM | POA: Diagnosis not present

## 2018-11-10 DIAGNOSIS — D123 Benign neoplasm of transverse colon: Secondary | ICD-10-CM | POA: Diagnosis not present

## 2018-11-10 DIAGNOSIS — Z86010 Personal history of colonic polyps: Secondary | ICD-10-CM | POA: Insufficient documentation

## 2018-11-10 DIAGNOSIS — K644 Residual hemorrhoidal skin tags: Secondary | ICD-10-CM | POA: Diagnosis not present

## 2018-11-10 DIAGNOSIS — K208 Other esophagitis: Secondary | ICD-10-CM | POA: Diagnosis not present

## 2018-11-10 HISTORY — DX: Polyp of colon: K63.5

## 2018-11-10 HISTORY — PX: ESOPHAGOGASTRODUODENOSCOPY (EGD) WITH PROPOFOL: SHX5813

## 2018-11-10 HISTORY — DX: Nontoxic goiter, unspecified: E04.9

## 2018-11-10 HISTORY — DX: Other hemorrhoids: K64.8

## 2018-11-10 HISTORY — DX: Benign neoplasm of colon, unspecified: D12.6

## 2018-11-10 HISTORY — PX: COLONOSCOPY WITH PROPOFOL: SHX5780

## 2018-11-10 SURGERY — COLONOSCOPY WITH PROPOFOL
Anesthesia: General

## 2018-11-10 MED ORDER — LIDOCAINE HCL (PF) 2 % IJ SOLN
INTRAMUSCULAR | Status: AC
  Filled 2018-11-10: qty 20

## 2018-11-10 MED ORDER — PROPOFOL 10 MG/ML IV BOLUS
INTRAVENOUS | Status: DC | PRN
  Administered 2018-11-10: 100 mg via INTRAVENOUS

## 2018-11-10 MED ORDER — PROPOFOL 10 MG/ML IV BOLUS
INTRAVENOUS | Status: AC
  Filled 2018-11-10: qty 20

## 2018-11-10 MED ORDER — PROPOFOL 500 MG/50ML IV EMUL
INTRAVENOUS | Status: DC | PRN
  Administered 2018-11-10: 180 ug/kg/min via INTRAVENOUS

## 2018-11-10 MED ORDER — PROPOFOL 500 MG/50ML IV EMUL
INTRAVENOUS | Status: AC
  Filled 2018-11-10: qty 50

## 2018-11-10 MED ORDER — FENTANYL CITRATE (PF) 100 MCG/2ML IJ SOLN
INTRAMUSCULAR | Status: DC | PRN
  Administered 2018-11-10: 50 ug via INTRAVENOUS

## 2018-11-10 MED ORDER — SODIUM CHLORIDE 0.9 % IV SOLN
INTRAVENOUS | Status: DC
  Administered 2018-11-10: 10:00:00 via INTRAVENOUS

## 2018-11-10 MED ORDER — EPHEDRINE SULFATE 50 MG/ML IJ SOLN
INTRAMUSCULAR | Status: DC | PRN
  Administered 2018-11-10: 10 mg via INTRAVENOUS

## 2018-11-10 MED ORDER — SODIUM CHLORIDE 0.9 % IV SOLN
INTRAVENOUS | Status: DC
  Administered 2018-11-10: 11:00:00 via INTRAVENOUS

## 2018-11-10 MED ORDER — FENTANYL CITRATE (PF) 100 MCG/2ML IJ SOLN
INTRAMUSCULAR | Status: AC
  Filled 2018-11-10: qty 2

## 2018-11-10 NOTE — Op Note (Signed)
[facility] Gastroenterology Patient Name: Linda Rios Procedure Date: 11/10/2018 11:07 AM MRN: [id number] Account #: [id number] Date of Birth: 02-11-1951 Admit Type: Outpatient Age: 68 Room: [facility] ENDO ROOM 3 Gender: Female Note Status: Finalized Procedure:            Upper GI endoscopy Indications:          Dysphagia Providers:            Lollie Sails, MD Referring MD:         Philmore Pali (Referring MD) Medicines:            Monitored Anesthesia Care Complications:        No immediate complications. Procedure:            Pre-Anesthesia Assessment:                       - ASA Grade Assessment: III - A patient with severe                        systemic disease.                       After obtaining informed consent, the endoscope was                        passed under direct vision. Throughout the procedure,                        the patient's blood pressure, pulse, and oxygen                        saturations were monitored continuously. The Endoscope                        was introduced through the mouth, and advanced to the                        third part of duodenum. The upper GI endoscopy was                        accomplished without difficulty. The patient tolerated                        the procedure well. Findings:      The Z-line was variable. Biopsies were taken with a cold forceps for       histology.      A single 6 mm submucosal nodule with a localized distribution was found       in the proximal esophagus, 18 cm from the incisors, no overlying mucosal       defect..      Diffuse and patchy minimal inflammation characterized by congestion       (edema) was found in the gastric body. Biopsies were taken with a cold       forceps for histology.      The examined duodenum was normal.      The cardia and gastric fundus were normal on retroflexion.      A single 3 mm sessile polyp with no bleeding and no stigmata of recent   bleeding was found on the anterior wall of the gastric body. The polyp  was removed with a cold biopsy forceps. Resection and retrieval were       complete. Impression:           - Z-line variable. Biopsied.                       - Submucosal nodule found in the esophagus.                       - Gastritis. Biopsied.                       - Normal examined duodenum. Recommendation:       - Await pathology results.                       - Continue present medications. Procedure Code(s):    --- Professional ---                       [phone], Esophagogastroduodenoscopy, flexible, transoral;                        with biopsy, single or multiple Diagnosis Code(s):    --- Professional ---                       K22.8, Other specified diseases of esophagus                       K29.70, Gastritis, unspecified, without bleeding                       R13.10, Dysphagia, unspecified CPT copyright 2019 American Medical Association. All rights reserved. The codes documented in this report are preliminary and upon coder review may  be revised to meet current compliance requirements. Lollie Sails, MD 11/10/2018 11:46:32 AM This report has been signed electronically. Number of Addenda: 0 Note Initiated On: 11/10/2018 11:07 AM      [facility]

## 2018-11-10 NOTE — Anesthesia Procedure Notes (Signed)
Date/Time: 11/10/2018 11:15 AM Performed by: Allean Found, CRNA Pre-anesthesia Checklist: Patient identified, Emergency Drugs available, Suction available, Timeout performed and Patient being monitored Patient Re-evaluated:Patient Re-evaluated prior to induction Oxygen Delivery Method: Nasal cannula Placement Confirmation: positive ETCO2

## 2018-11-10 NOTE — Anesthesia Post-op Follow-up Note (Signed)
Anesthesia QCDR form completed.        

## 2018-11-10 NOTE — Transfer of Care (Signed)
Immediate Anesthesia Transfer of Care Note  Patient: Linda Rios  Procedure(s) Performed: COLONOSCOPY WITH PROPOFOL (N/A ) ESOPHAGOGASTRODUODENOSCOPY (EGD) WITH PROPOFOL (N/A )  Patient Location: PACU  Anesthesia Type:General  Level of Consciousness: awake and alert   Airway & Oxygen Therapy: Patient Spontanous Breathing  Post-op Assessment: Report given to RN and Post -op Vital signs reviewed and stable  Post vital signs: Reviewed and stable  Last Vitals:  Vitals Value Taken Time  BP 118/66 11/10/18 1225  Temp    Pulse 74 11/10/18 1225  Resp 26 11/10/18 1225  SpO2 98 % 11/10/18 1225  Vitals shown include unvalidated device data.  Last Pain:  Vitals:   11/10/18 0950  TempSrc: Tympanic  PainSc: 0-No pain         Complications: No apparent anesthesia complications

## 2018-11-10 NOTE — Op Note (Signed)
[facility] Gastroenterology Patient Name: Linda Rios Procedure Date: 11/10/2018 11:06 AM MRN: [id number] Account #: [id number] Date of Birth: [birth year] Admit Type: Outpatient Age: 68 Room: [facility] ENDO ROOM 3 Gender: Female Note Status: Finalized Procedure:            Colonoscopy Indications:          Personal history of colonic polyps Providers:            Lollie Sails, MD Referring MD:         Philmore Pali (Referring MD) Medicines:            Monitored Anesthesia Care Complications:        No immediate complications. Procedure:            Pre-Anesthesia Assessment:                       - ASA Grade Assessment: III - A patient with severe                        systemic disease.                       After obtaining informed consent, the colonoscope was                        passed under direct vision. Throughout the procedure,                        the patient's blood pressure, pulse, and oxygen                        saturations were monitored continuously. The was                        introduced through the anus and advanced to the the                        cecum, identified by appendiceal orifice and ileocecal                        valve. The patient tolerated the procedure well. The                        quality of the bowel preparation was fair. Findings:      A 2 mm polyp was found in the transverse colon. The polyp was sessile.       The polyp was removed with a cold biopsy forceps. Resection and       retrieval were complete.      A 4 mm polyp was found in the hepatic flexure. The polyp was sessile.       The polyp was removed with a cold snare. Resection and retrieval were       complete.      A moderate amount of solid stool was found in the proximal descending       colon, This was moved and verified to be loose in the lumen.      The retroflexed view of the distal rectum and anal verge was normal and       showed no anal or rectal  abnormalities.      Non-bleeding external and internal  hemorrhoids were found during       anoscopy. The hemorrhoids were small and Grade II (internal hemorrhoids       that prolapse but reduce spontaneously).      The digital rectal exam was otherwise normal. Impression:           - Preparation of the colon was fair.                       - One 2 mm polyp in the transverse colon, removed with                        a cold biopsy forceps. Resected and retrieved.                       - One 4 mm polyp at the hepatic flexure, removed with a                        cold snare. Resected and retrieved.                       - Stool in the proximal descending colon.                       - The distal rectum and anal verge are normal on                        retroflexion view.                       - Non-bleeding external and internal hemorrhoids. Recommendation:       - Discharge patient to home.                       - Soft diet today, then advance as tolerated to advance                        diet as tolerated. Procedure Code(s):    --- Professional ---                       [phone], Colonoscopy, flexible; with removal of tumor(s),                        polyp(s), or other lesion(s) by snare technique                       45380, 49, Colonoscopy, flexible; with biopsy, single                        or multiple Diagnosis Code(s):    --- Professional ---                       K64.1, Second degree hemorrhoids                       K63.5, Polyp of colon                       Z86.010, Personal history of colonic polyps CPT copyright 2019 American Medical Association. All rights reserved. The codes documented in this report are preliminary and upon coder review  may  be revised to meet current compliance requirements. Lollie Sails, MD 11/10/2018 12:23:05 PM This report has been signed electronically. Number of Addenda: 0 Note Initiated On: 11/10/2018 11:06 AM Scope Withdrawal Time: 0 hours 4  minutes 52 seconds  Total Procedure Duration: 0 hours 23 minutes 33 seconds       [facility]

## 2018-11-10 NOTE — Anesthesia Postprocedure Evaluation (Signed)
Anesthesia Post Note  Patient: Linda Rios  Procedure(s) Performed: COLONOSCOPY WITH PROPOFOL (N/A ) ESOPHAGOGASTRODUODENOSCOPY (EGD) WITH PROPOFOL (N/A )  Patient location during evaluation: Endoscopy Anesthesia Type: General Level of consciousness: awake and alert Pain management: pain level controlled Vital Signs Assessment: post-procedure vital signs reviewed and stable Respiratory status: spontaneous breathing and respiratory function stable Cardiovascular status: stable Anesthetic complications: no     Last Vitals:  Vitals:   11/10/18 1235 11/10/18 1245  BP: 137/61 131/69  Pulse:    Resp:    Temp:    SpO2:      Last Pain:  Vitals:   11/10/18 1245  TempSrc:   PainSc: 0-No pain                 KEPHART,WILLIAM K

## 2018-11-10 NOTE — H&P (Signed)
Outpatient short stay form Pre-procedure 11/10/2018 11:11 AM Lollie Sails MD  Primary Physician: Charlott Holler, NP  Reason for visit: EGD and colonoscopy  History of present illness: Patient is a 68 year old female presenting today for an EGD and colonoscopy.  She has complained of some dysphagia and a personal history of colon polyps.  Her dysphagia began to be more of an issue about 3 months ago.  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  She did not hang up the tablet.  He had a CT scan of the neck which shows a goiter.  He does not regurgitate foods.  No abnormality to the caliber rather a shift to the side is noted.  She takes no aspirin or blood thinning agent.    Current Facility-Administered Medications:  .  0.9 %  sodium chloride infusion, , Intravenous, Continuous, Lollie Sails, MD .  0.9 %  sodium chloride infusion, , Intravenous, Continuous, Lollie Sails, MD, Last Rate: 20 mL/hr at 11/10/18 1011  Medications Prior to Admission  Medication Sig Dispense Refill Last Dose  . amLODipine (NORVASC) 5 MG tablet Take 5 mg by mouth daily.   11/07/2018  . hydrochlorothiazide (HYDRODIURIL) 12.5 MG tablet Take 12.5 mg by mouth daily.   11/04/2018  . methylcellulose (CITRUCEL) oral powder Take by mouth daily.   11/04/2018  . omeprazole (PRILOSEC) 40 MG capsule Take 40 mg by mouth daily.   11/04/2018  . acetaminophen (TYLENOL) 500 MG tablet Take 500 mg by mouth every 4 (four) hours as needed for mild pain (For Mild Pain).   11/04/2018  . Calcium Carb-Cholecalciferol (CALCIUM 600+D) 600-800 MG-UNIT TABS Take 1 tablet by mouth daily.   11/04/2018  . diphenhydramine-acetaminophen (TYLENOL PM) 25-500 MG TABS Take 2 tablets by mouth at bedtime as needed (for sleep).      . enoxaparin (LOVENOX) 40 MG/0.4ML injection Inject 0.4 mLs (40 mg total) into the skin daily. (Patient not taking: Reported on 11/10/2018) 14  Syringe 0 Not Taking at Unknown time  . Glucosamine-Chondroit-Vit C-Mn (GLUCOSAMINE CHONDR 1500 COMPLX PO) Take 1 tablet by mouth 2 (two) times daily.   11/04/2018  . lovastatin (MEVACOR) 20 MG tablet Take 20 mg by mouth at bedtime.   11/04/2018  . meloxicam (MOBIC) 7.5 MG tablet Take 7.5 mg by mouth daily.   11/04/2018  . Multiple Vitamins-Minerals (ONE DAILY FOR WOMEN) TABS Take 1 tablet by mouth daily.   11/04/2018  . Oxycodone HCl 10 MG TABS 1 by mouth every 4 hours as needed for moderate pain, 1 1/2 by mouth every 4 hours as needed for severe pain 60 tablet 0 Not Taking at Unknown time  . polyethylene glycol (MIRALAX / GLYCOLAX) packet Take 17 g by mouth daily as needed.   Not Taking at Unknown time     Allergies  Allergen Reactions  . Pravastatin Other (See Comments)    Reaction:  Headaches   . Vancomycin Hives and Itching    Patient with redness, hives, and itching to her scalp.     Past Medical History:  Diagnosis Date  . Arthritis   . Diverticulosis   . Goiter    Right and left sides  . Hyperlipidemia   . Hyperplastic colon polyp   . Hypertension   . Internal hemorrhoids   . Tubular adenoma of colon     Review of systems:      Physical Exam  Heart and lungs: Regular rate and rhythm without rub or gallop lungs are bilaterally clear    HEENT: Normocephalic atraumatic eyes are anicteric delete that    Other:     Pertinant exam for procedure: Soft nontender nondistended bowel sounds positive normoactive    Planned proceedures: EGD, colonoscopy and indicated procedures. I have discussed the risks benefits and complications of procedures to include not limited to bleeding, infection, perforation and the risk of sedation and the patient wishes to proceed.    Lollie Sails, MD Gastroenterology 11/10/2018  11:11 AM

## 2018-11-10 NOTE — Anesthesia Preprocedure Evaluation (Signed)
Anesthesia Evaluation  Patient identified by MRN, date of birth, ID band Patient awake    Reviewed: Allergy & Precautions, H&P , NPO status , Patient's Chart, lab work & pertinent test results, reviewed documented beta blocker date and time   Airway Mallampati: II   Neck ROM: full    Dental  (+) Poor Dentition   Pulmonary neg pulmonary ROS,    Pulmonary exam normal        Cardiovascular Exercise Tolerance: Good hypertension, On Medications negative cardio ROS Normal cardiovascular exam Rhythm:regular Rate:Normal     Neuro/Psych negative neurological ROS  negative psych ROS   GI/Hepatic negative GI ROS, Neg liver ROS,   Endo/Other  negative endocrine ROS  Renal/GU negative Renal ROS  negative genitourinary   Musculoskeletal   Abdominal   Peds  Hematology negative hematology ROS (+)   Anesthesia Other Findings Past Medical History: No date: Arthritis No date: Diverticulosis No date: Goiter     Comment:  Right and left sides No date: Hyperlipidemia No date: Hyperplastic colon polyp No date: Hypertension No date: Internal hemorrhoids No date: Tubular adenoma of colon Past Surgical History: No date: APPENDECTOMY No date: CHOLECYSTECTOMY No date: COLONOSCOPY No date: FOOT SURGERY; Right No date: KNEE ARTHROSCOPY; Left 09/29/2014: TOTAL KNEE ARTHROPLASTY; Left     Comment:  Procedure: TOTAL KNEE ARTHROPLASTY;  Surgeon: Leanor Kail, MD;  Location: [facility] ORS;  Service: Orthopedics;              Laterality: Left; BMI    Body Mass Index: 42.77 kg/m     Reproductive/Obstetrics negative OB ROS                             Anesthesia Physical Anesthesia Plan  ASA: III  Anesthesia Plan: General   Post-op Pain Management:    Induction:   PONV Risk Score and Plan:   Airway Management Planned:   Additional Equipment:   Intra-op Plan:   Post-operative Plan:    Informed Consent: I have reviewed the patients History and Physical, chart, labs and discussed the procedure including the risks, benefits and alternatives for the proposed anesthesia with the patient or authorized representative who has indicated his/her understanding and acceptance.     Dental Advisory Given  Plan Discussed with: CRNA  Anesthesia Plan Comments:         Anesthesia Quick Evaluation

## 2018-11-11 LAB — SURGICAL PATHOLOGY

## 2018-11-20 DIAGNOSIS — E042 Nontoxic multinodular goiter: Secondary | ICD-10-CM | POA: Diagnosis not present

## 2018-12-11 DIAGNOSIS — E049 Nontoxic goiter, unspecified: Secondary | ICD-10-CM | POA: Diagnosis not present

## 2018-12-11 DIAGNOSIS — Z6841 Body Mass Index (BMI) 40.0 and over, adult: Secondary | ICD-10-CM | POA: Diagnosis not present

## 2018-12-11 DIAGNOSIS — Z01818 Encounter for other preprocedural examination: Secondary | ICD-10-CM | POA: Diagnosis not present

## 2018-12-12 ENCOUNTER — Other Ambulatory Visit: Payer: Self-pay

## 2018-12-12 ENCOUNTER — Encounter
Admission: RE | Admit: 2018-12-12 | Discharge: 2018-12-12 | Disposition: A | Discharge status: Home / Self Care | Payer: Medicare HMO | Source: Ambulatory Visit | Attending: Otolaryngology | Admitting: Otolaryngology

## 2018-12-12 DIAGNOSIS — Z20828 Contact with and (suspected) exposure to other viral communicable diseases: Secondary | ICD-10-CM | POA: Insufficient documentation

## 2018-12-12 DIAGNOSIS — Z01812 Encounter for preprocedural laboratory examination: Secondary | ICD-10-CM | POA: Diagnosis not present

## 2018-12-12 HISTORY — DX: Gastro-esophageal reflux disease without esophagitis: K21.9

## 2018-12-12 LAB — SARS CORONAVIRUS 2 (TAT 6-24 HRS): SARS Coronavirus 2: NEGATIVE

## 2018-12-12 NOTE — Pre-Procedure Instructions (Signed)
Called Dr Sonny Masters office for orders.   [facility] in [place] for blood work results and EKG.

## 2018-12-12 NOTE — Patient Instructions (Signed)
Your procedure is scheduled on: 12/17/2018 Wed Report to Same Day Surgery 2nd floor medical mall [facility] Entrance-take elevator on left to 2nd floor.  Check in with surgery information desk.) To find out your arrival time please call [phone] between 1PM - 3PM on 12/16/2018 Tues  Remember: Instructions that are not followed completely may result in serious medical risk, up to and including death, or upon the discretion of your surgeon and anesthesiologist your surgery may need to be rescheduled.    _x___ 1. Do not eat food after midnight the night before your procedure. You may drink clear liquids up to 2 hours before you are scheduled to arrive at the hospital for your procedure.  Do not drink clear liquids within 2 hours of your scheduled arrival to the hospital.  Clear liquids include  --Water or Apple juice without pulp  --Clear carbohydrate beverage such as ClearFast or Gatorade  --Black Coffee or Clear Tea (No milk, no creamers, do not add anything to                  the coffee or Tea Type 1 and type 2 diabetics should only drink water.   ____Ensure clear carbohydrate drink on the way to the hospital for bariatric patients  ____Ensure clear carbohydrate drink 3 hours before surgery.   No gum chewing or hard candies.     __x__ 2. No Alcohol for 24 hours before or after surgery.   __x__3. No Smoking or e-cigarettes for 24 prior to surgery.  Do not use any chewable tobacco products for at least 6 hour prior to surgery   ____  4. Bring all medications with you on the day of surgery if instructed.    __x__ 5. Notify your doctor if there is any change in your medical condition     (cold, fever, infections).    x___6. On the morning of surgery brush your teeth with toothpaste and water.  You may rinse your mouth with mouth wash if you wish.  Do not swallow any toothpaste or mouthwash.   Do not wear jewelry, make-up, hairpins, clips or nail polish.  Do not wear lotions,  powders, or perfumes. You may wear deodorant.  Do not shave 48 hours prior to surgery. Men may shave face and neck.  Do not bring valuables to the hospital.    [facility] is not responsible for any belongings or valuables.               Contacts, dentures or bridgework may not be worn into surgery.  Leave your suitcase in the car. After surgery it may be brought to your room.  For patients admitted to the hospital, discharge time is determined by your                       treatment team.  _  Patients discharged the day of surgery will not be allowed to drive home.  You will need someone to drive you home and stay with you the night of your procedure.    Please read over the following fact sheets that you were given:   [facility] Preparing for Surgery and or MRSA Information   _x___ Take anti-hypertensive listed below, cardiac, seizure, asthma,     anti-reflux and psychiatric medicines. These include:  1. amLODipine (NORVASC) 5 MG tablet  2.omeprazole (PRILOSEC) 40 MG capsule  3.  4.  5.  6.  ____Fleets enema or Magnesium Citrate as  directed.   _x___ Use CHG Soap or sage wipes as directed on instruction sheet   ____ Use inhalers on the day of surgery and bring to hospital day of surgery  ____ Stop Metformin and Janumet 2 days prior to surgery.    ____ Take 1/2 of usual insulin dose the night before surgery and none on the morning     surgery.   _x___ Follow recommendations from Cardiologist, Pulmonologist or PCP regarding          stopping Aspirin, Coumadin, Plavix ,Eliquis, Effient, or Pradaxa, and Pletal.  X____Stop Anti-inflammatories such as Advil, Aleve, Ibuprofen, Motrin, Naproxen, Naprosyn, Goodies powders or aspirin products. OK to take Tylenol and                          Celebrex.   _x___ Stop supplements until after surgery.  But may continue Vitamin D, Vitamin B,       and multivitamin.   ____ Bring C-Pap to the hospital.

## 2018-12-17 ENCOUNTER — Other Ambulatory Visit: Payer: Self-pay

## 2018-12-17 ENCOUNTER — Encounter
Admission: AD | Disposition: A | Discharge status: Home / Self Care | Payer: Self-pay | Source: Home / Self Care | Attending: Otolaryngology

## 2018-12-17 ENCOUNTER — Ambulatory Visit: Payer: Medicare HMO | Admitting: Certified Registered Nurse Anesthetist

## 2018-12-17 ENCOUNTER — Other Ambulatory Visit: Payer: Self-pay | Admitting: Otolaryngology

## 2018-12-17 ENCOUNTER — Observation Stay
Admission: AD | Admit: 2018-12-17 | Discharge: 2018-12-18 | Disposition: A | Discharge status: Home / Self Care | Payer: Medicare HMO | Attending: Otolaryngology | Admitting: Otolaryngology

## 2018-12-17 DIAGNOSIS — K219 Gastro-esophageal reflux disease without esophagitis: Secondary | ICD-10-CM | POA: Insufficient documentation

## 2018-12-17 DIAGNOSIS — D34 Benign neoplasm of thyroid gland: Secondary | ICD-10-CM | POA: Diagnosis not present

## 2018-12-17 DIAGNOSIS — E041 Nontoxic single thyroid nodule: Secondary | ICD-10-CM | POA: Diagnosis not present

## 2018-12-17 DIAGNOSIS — Z6841 Body Mass Index (BMI) 40.0 and over, adult: Secondary | ICD-10-CM | POA: Diagnosis not present

## 2018-12-17 DIAGNOSIS — Z7982 Long term (current) use of aspirin: Secondary | ICD-10-CM | POA: Diagnosis not present

## 2018-12-17 DIAGNOSIS — E042 Nontoxic multinodular goiter: Principal | ICD-10-CM | POA: Insufficient documentation

## 2018-12-17 DIAGNOSIS — E049 Nontoxic goiter, unspecified: Secondary | ICD-10-CM | POA: Diagnosis not present

## 2018-12-17 DIAGNOSIS — Z79899 Other long term (current) drug therapy: Secondary | ICD-10-CM | POA: Insufficient documentation

## 2018-12-17 DIAGNOSIS — M1990 Unspecified osteoarthritis, unspecified site: Secondary | ICD-10-CM | POA: Insufficient documentation

## 2018-12-17 DIAGNOSIS — I10 Essential (primary) hypertension: Secondary | ICD-10-CM | POA: Diagnosis not present

## 2018-12-17 DIAGNOSIS — E669 Obesity, unspecified: Secondary | ICD-10-CM | POA: Diagnosis not present

## 2018-12-17 HISTORY — PX: THYROIDECTOMY: SHX17

## 2018-12-17 LAB — CALCIUM: Calcium: 9 mg/dL (ref 8.9–10.3)

## 2018-12-17 SURGERY — THYROIDECTOMY
Anesthesia: General | Laterality: Bilateral

## 2018-12-17 MED ORDER — FENTANYL CITRATE (PF) 100 MCG/2ML IJ SOLN
INTRAMUSCULAR | Status: AC
  Administered 2018-12-17: 12:00:00 25 ug via INTRAVENOUS
  Filled 2018-12-17: qty 2

## 2018-12-17 MED ORDER — PHENYLEPHRINE HCL (PRESSORS) 10 MG/ML IV SOLN
INTRAVENOUS | Status: AC
  Filled 2018-12-17: qty 1

## 2018-12-17 MED ORDER — ROCURONIUM BROMIDE 100 MG/10ML IV SOLN
INTRAVENOUS | Status: DC | PRN
  Administered 2018-12-17: 5 mg via INTRAVENOUS

## 2018-12-17 MED ORDER — ACETAMINOPHEN 160 MG/5ML PO SOLN
650.0000 mg | Freq: Four times a day (QID) | ORAL | Status: DC | PRN
  Filled 2018-12-17: qty 20.3

## 2018-12-17 MED ORDER — ONDANSETRON HCL 4 MG PO TABS: 4.0000 mg | ORAL_TABLET | ORAL | Status: DC | PRN

## 2018-12-17 MED ORDER — LIDOCAINE-EPINEPHRINE (PF) 1 %-1:200000 IJ SOLN
INTRAMUSCULAR | Status: AC
  Filled 2018-12-17: qty 30

## 2018-12-17 MED ORDER — SODIUM CHLORIDE 0.9 % IV SOLN
INTRAVENOUS | Status: DC | PRN
  Administered 2018-12-17: 09:00:00 35 ug/min via INTRAVENOUS

## 2018-12-17 MED ORDER — MORPHINE SULFATE (PF) 2 MG/ML IV SOLN
2.0000 mg | INTRAVENOUS | Status: DC | PRN
  Administered 2018-12-18: 13:00:00 2 mg via INTRAVENOUS
  Filled 2018-12-17: qty 1

## 2018-12-17 MED ORDER — FENTANYL CITRATE (PF) 100 MCG/2ML IJ SOLN
INTRAMUSCULAR | Status: AC
  Filled 2018-12-17: qty 2

## 2018-12-17 MED ORDER — CALCIUM CARBONATE-VITAMIN D 500-200 MG-UNIT PO TABS
2.0000 | ORAL_TABLET | Freq: Two times a day (BID) | ORAL | Status: DC
  Administered 2018-12-17 – 2018-12-18 (×3): 2 via ORAL
  Filled 2018-12-17 (×3): qty 2

## 2018-12-17 MED ORDER — MIDAZOLAM HCL 2 MG/2ML IJ SOLN
INTRAMUSCULAR | Status: DC | PRN
  Administered 2018-12-17: 2 mg via INTRAVENOUS

## 2018-12-17 MED ORDER — GLYCOPYRROLATE 0.2 MG/ML IJ SOLN
INTRAMUSCULAR | Status: AC
  Filled 2018-12-17: qty 1

## 2018-12-17 MED ORDER — DEXAMETHASONE SODIUM PHOSPHATE 10 MG/ML IJ SOLN
INTRAMUSCULAR | Status: AC
  Filled 2018-12-17: qty 1

## 2018-12-17 MED ORDER — SUCCINYLCHOLINE CHLORIDE 20 MG/ML IJ SOLN
INTRAMUSCULAR | Status: AC
  Filled 2018-12-17: qty 1

## 2018-12-17 MED ORDER — BACITRACIN ZINC 500 UNIT/GM EX OINT
1.0000 "application " | TOPICAL_OINTMENT | Freq: Three times a day (TID) | CUTANEOUS | Status: DC
  Administered 2018-12-17 – 2018-12-18 (×4): 1 via TOPICAL
  Filled 2018-12-17: qty 0.9

## 2018-12-17 MED ORDER — LIDOCAINE HCL (CARDIAC) PF 100 MG/5ML IV SOSY
PREFILLED_SYRINGE | INTRAVENOUS | Status: DC | PRN
  Administered 2018-12-17: 100 mg via INTRAVENOUS

## 2018-12-17 MED ORDER — PROMETHAZINE HCL 25 MG/ML IJ SOLN: 6.2500 mg | INTRAMUSCULAR | Status: DC | PRN

## 2018-12-17 MED ORDER — HYDROCHLOROTHIAZIDE 12.5 MG PO CAPS
12.5000 mg | ORAL_CAPSULE | Freq: Every day | ORAL | Status: DC
  Administered 2018-12-18: 08:00:00 12.5 mg via ORAL
  Filled 2018-12-17: qty 1

## 2018-12-17 MED ORDER — PANTOPRAZOLE SODIUM 40 MG PO TBEC
40.0000 mg | DELAYED_RELEASE_TABLET | Freq: Every day | ORAL | Status: DC
  Administered 2018-12-18: 08:00:00 40 mg via ORAL
  Filled 2018-12-17: qty 1

## 2018-12-17 MED ORDER — OXYCODONE HCL 5 MG PO TABS: 5.0000 mg | ORAL_TABLET | Freq: Once | ORAL | Status: DC | PRN

## 2018-12-17 MED ORDER — MIDAZOLAM HCL 2 MG/2ML IJ SOLN
INTRAMUSCULAR | Status: AC
  Filled 2018-12-17: qty 2

## 2018-12-17 MED ORDER — LIDOCAINE-EPINEPHRINE (PF) 1 %-1:200000 IJ SOLN
INTRAMUSCULAR | Status: DC | PRN
  Administered 2018-12-17: 8 mL

## 2018-12-17 MED ORDER — AMLODIPINE BESYLATE 5 MG PO TABS
5.0000 mg | ORAL_TABLET | Freq: Every day | ORAL | Status: DC
  Administered 2018-12-18: 5 mg via ORAL
  Filled 2018-12-17: qty 1

## 2018-12-17 MED ORDER — MEPERIDINE HCL 50 MG/ML IJ SOLN: 6.2500 mg | INTRAMUSCULAR | Status: DC | PRN

## 2018-12-17 MED ORDER — SUCCINYLCHOLINE CHLORIDE 20 MG/ML IJ SOLN
INTRAMUSCULAR | Status: DC | PRN
  Administered 2018-12-17: 120 mg via INTRAVENOUS

## 2018-12-17 MED ORDER — LEVOTHYROXINE SODIUM 50 MCG PO TABS
125.0000 ug | ORAL_TABLET | Freq: Every day | ORAL | Status: DC
  Administered 2018-12-18: 125 ug via ORAL
  Filled 2018-12-17: qty 1

## 2018-12-17 MED ORDER — FENTANYL CITRATE (PF) 100 MCG/2ML IJ SOLN
25.0000 ug | INTRAMUSCULAR | Status: AC | PRN
  Administered 2018-12-17 (×6): 25 ug via INTRAVENOUS

## 2018-12-17 MED ORDER — PROPOFOL 10 MG/ML IV BOLUS
INTRAVENOUS | Status: DC | PRN
  Administered 2018-12-17: 200 mg via INTRAVENOUS
  Administered 2018-12-17 (×2): 50 mg via INTRAVENOUS

## 2018-12-17 MED ORDER — ONDANSETRON HCL 4 MG/2ML IJ SOLN
4.0000 mg | INTRAMUSCULAR | Status: DC | PRN
  Administered 2018-12-17: 20:00:00 4 mg via INTRAVENOUS
  Filled 2018-12-17: qty 2

## 2018-12-17 MED ORDER — DOCUSATE SODIUM 100 MG PO CAPS
100.0000 mg | ORAL_CAPSULE | Freq: Two times a day (BID) | ORAL | Status: DC
  Administered 2018-12-17 – 2018-12-18 (×3): 100 mg via ORAL
  Filled 2018-12-17 (×3): qty 1

## 2018-12-17 MED ORDER — BACITRACIN ZINC 500 UNIT/GM EX OINT
TOPICAL_OINTMENT | CUTANEOUS | Status: AC
  Filled 2018-12-17: qty 28.35

## 2018-12-17 MED ORDER — LACTATED RINGERS IV SOLN
INTRAVENOUS | Status: DC
  Administered 2018-12-17: 08:00:00 20 mL/h via INTRAVENOUS
  Administered 2018-12-17: 12:00:00 via INTRAVENOUS

## 2018-12-17 MED ORDER — ONDANSETRON HCL 4 MG/2ML IJ SOLN
INTRAMUSCULAR | Status: AC
  Filled 2018-12-17: qty 2

## 2018-12-17 MED ORDER — DEXAMETHASONE SODIUM PHOSPHATE 10 MG/ML IJ SOLN
INTRAMUSCULAR | Status: DC | PRN
  Administered 2018-12-17: 10 mg via INTRAVENOUS

## 2018-12-17 MED ORDER — OXYCODONE HCL 5 MG/5ML PO SOLN: 5.0000 mg | Freq: Once | ORAL | Status: DC | PRN

## 2018-12-17 MED ORDER — KCL IN DEXTROSE-NACL 40-5-0.45 MEQ/L-%-% IV SOLN
INTRAVENOUS | Status: DC
  Administered 2018-12-17 – 2018-12-18 (×2): via INTRAVENOUS
  Filled 2018-12-17 (×4): qty 1000

## 2018-12-17 MED ORDER — ONDANSETRON HCL 4 MG/2ML IJ SOLN
INTRAMUSCULAR | Status: DC | PRN
  Administered 2018-12-17: 4 mg via INTRAVENOUS

## 2018-12-17 MED ORDER — LIDOCAINE HCL (PF) 2 % IJ SOLN
INTRAMUSCULAR | Status: AC
  Filled 2018-12-17: qty 10

## 2018-12-17 MED ORDER — PROPOFOL 10 MG/ML IV BOLUS
INTRAVENOUS | Status: AC
  Filled 2018-12-17: qty 40

## 2018-12-17 MED ORDER — ROCURONIUM BROMIDE 50 MG/5ML IV SOLN
INTRAVENOUS | Status: AC
  Filled 2018-12-17: qty 1

## 2018-12-17 MED ORDER — HYDROCODONE-ACETAMINOPHEN 5-325 MG PO TABS
1.0000 | ORAL_TABLET | ORAL | Status: DC | PRN
  Administered 2018-12-17 – 2018-12-18 (×4): 2 via ORAL
  Filled 2018-12-17 (×4): qty 2

## 2018-12-17 MED ORDER — FENTANYL CITRATE (PF) 100 MCG/2ML IJ SOLN
INTRAMUSCULAR | Status: DC | PRN
  Administered 2018-12-17 (×2): 25 ug via INTRAVENOUS
  Administered 2018-12-17: 100 ug via INTRAVENOUS
  Administered 2018-12-17 (×5): 50 ug via INTRAVENOUS

## 2018-12-17 SURGICAL SUPPLY — 35 items
BLADE SURG 15 STRL LF DISP TIS (BLADE) ×1 IMPLANT
BLADE SURG 15 STRL SS (BLADE) ×3
CANISTER SUCT 1200ML W/VALVE (MISCELLANEOUS) ×3 IMPLANT
CORD BIP STRL DISP 12FT (MISCELLANEOUS) ×3 IMPLANT
COVER WAND RF STERILE (DRAPES) ×3 IMPLANT
DRAIN TLS ROUND 10FR (DRAIN) ×2 IMPLANT
DRAPE MAG INST 16X20 L/F (DRAPES) ×3 IMPLANT
DRSG TEGADERM 2-3/8X2-3/4 SM (GAUZE/BANDAGES/DRESSINGS) ×3 IMPLANT
ELECT LARYNGEAL 6/7 (MISCELLANEOUS) ×3
ELECT LARYNGEAL 8/9 (MISCELLANEOUS)
ELECT REM PT RETURN 9FT ADLT (ELECTROSURGICAL) ×3
ELECTRODE LARYNGEAL 6/7 (MISCELLANEOUS) ×1 IMPLANT
ELECTRODE LARYNGEAL 8/9 (MISCELLANEOUS) ×1 IMPLANT
ELECTRODE REM PT RTRN 9FT ADLT (ELECTROSURGICAL) ×1 IMPLANT
FORCEPS JEWEL BIP 4-3/4 STR (INSTRUMENTS) ×3 IMPLANT
GAUZE 4X4 16PLY RFD (DISPOSABLE) ×3 IMPLANT
GLOVE BIO SURGEON STRL SZ7.5 (GLOVE) ×6 IMPLANT
GOWN STRL REUS W/ TWL LRG LVL3 (GOWN DISPOSABLE) ×2 IMPLANT
GOWN STRL REUS W/TWL LRG LVL3 (GOWN DISPOSABLE) ×9
HEMOSTAT SURGICEL 2X3 (HEMOSTASIS) ×3 IMPLANT
HOOK STAY BLUNT/RETRACTOR 5M (MISCELLANEOUS) ×3 IMPLANT
KIT TURNOVER KIT A (KITS) ×3 IMPLANT
LABEL OR SOLS (LABEL) ×3 IMPLANT
NS IRRIG 500ML POUR BTL (IV SOLUTION) ×3 IMPLANT
PACK HEAD/NECK (MISCELLANEOUS) ×3 IMPLANT
PROBE NEUROSIGN BIPOL (MISCELLANEOUS) ×1 IMPLANT
PROBE NEUROSIGN BIPOLAR (MISCELLANEOUS) ×3
SHEARS HARMONIC 9CM CVD (BLADE) ×3 IMPLANT
SPONGE KITTNER 5P (MISCELLANEOUS) ×10 IMPLANT
SUT PROLENE 3 0 PS 2 (SUTURE) ×3 IMPLANT
SUT SILK 2 0 (SUTURE) ×3
SUT SILK 2 0 SH (SUTURE) ×3 IMPLANT
SUT SILK 2-0 18XBRD TIE 12 (SUTURE) ×1 IMPLANT
SUT VIC AB 4-0 RB1 18 (SUTURE) ×3 IMPLANT
SYSTEM CHEST DRAIN TLS 7FR (DRAIN) ×2 IMPLANT

## 2018-12-17 NOTE — Transfer of Care (Signed)
Immediate Anesthesia Transfer of Care Note  Patient: Linda Rios  Procedure(s) Performed: TOTAL THYROIDECTOMY (Bilateral )  Patient Location: PACU  Anesthesia Type:General  Level of Consciousness: awake and alert   Airway & Oxygen Therapy: Patient Spontanous Breathing and Patient connected to face mask oxygen  Post-op Assessment: Report given to RN and Post -op Vital signs reviewed and stable  Post vital signs: Reviewed and stable  Last Vitals:  Vitals Value Taken Time  BP 163/99   Temp    Pulse 90 12/17/18 1118  Resp 12   SpO2 96 % 12/17/18 1118  Vitals shown include unvalidated device data.  Last Pain:  Vitals:   12/17/18 0721  TempSrc: Tympanic  PainSc: 0-No pain      Patients Stated Pain Goal: 0 ([id number])  Complications: No apparent anesthesia complications

## 2018-12-17 NOTE — Anesthesia Postprocedure Evaluation (Signed)
Anesthesia Post Note  Patient: Linda Rios  Procedure(s) Performed: TOTAL THYROIDECTOMY (Bilateral )  Patient location during evaluation: PACU Anesthesia Type: General Level of consciousness: awake and alert and oriented Pain management: pain level controlled Vital Signs Assessment: post-procedure vital signs reviewed and stable Respiratory status: spontaneous breathing, nonlabored ventilation and respiratory function stable Cardiovascular status: blood pressure returned to baseline and stable Postop Assessment: no signs of nausea or vomiting Anesthetic complications: no     Last Vitals:  Vitals:   12/17/18 1237 12/17/18 1352  BP: 139/68 130/71  Pulse: 78 70  Resp: 16 17  Temp: 36.8 C 36.9 C  SpO2: 95% 97%    Last Pain:  Vitals:   12/17/18 1412  TempSrc:   PainSc: 7                  Delbert Darley

## 2018-12-17 NOTE — Op Note (Signed)
12/17/2018  11:09 AM    Leo Rod  [id number]   Pre-Op Diagnosis:  Multinodular goiter  Post-op Diagnosis: Multinodular goiter  Procedure:   Total Thyroidectomy  Surgeon:  [name]   First Assistant: Margaretha Sheffield  Anesthesia:  General endotracheal  EBL:  Less than 25 cc  Complications:  None  Findings: Multinodular goiter, multiple nodules on right with associated inflammation, large dominant nodule on left  Procedure: After the patient was identified in holding and the procedure was reviewed.  The patient was taken to the operating room and with the patient in a comfortable supine position,  general endotracheal anesthesia was induced without difficulty.  A nerve monitor on the endotracheal tube was visualized to be between the cords at the time of intubation. A proper time-out was performed, confirming the operative site and procedure.  The patient was placed on a shoulder roll and position. Skin was injected with 1% lidocaine with epinephrine 1-200,000. The patient was then prepped and draped in the usual sterile fashion. A 15 blade was used to incise the skin carrying the incision down through the subcutaneous tissues. The platysma muscle was divided with the Bovie. The strap muscles were identified in the midline and divided in the midline, retracting them laterally. Small anterior jugular veins were either clamped and suture divided or divided with the Harmonic scalpel. The strap muscles were retracted laterally off of the capsule of the right lobe of the thyroid gland. This was then finger dissected to loosen loose attachments to the gland. Dissection proceeded superiorly, dissecting the superior pole of the gland. The superior pole vessels were divided with the Harmonic scalpel. The superior pole was retracted inferiorly and dissection proceeded around the lateral aspect of the gland, dividing vascular attachments with the Harmonic scalpel. The inferior pole was  dissected, identifying a small structure consistent with the parathyroid, and the inferior pole vessels were divided with the Harmonic scalpel. Care was taken to divide all of these vessels right at the capsule of the gland. The gland was then retracted medially and delivered from the wound. Dissection along the trachea eventually revealed the recurrent laryngeal nerve which stimulated properly, though difficult to find due to inflammation in the area. The gland was then dissected off of the trachea, dividing Berry's ligament with the nerve carefully protected.   Next the right lobe of the thyroid gland was dissected in the same fashion as described above. Once again the superior and inferior pole vessels were divided right at the capsule of the gland and structures consistent with parathyroid tissue were identified both superiorly and inferiorly. Retracting the gland medially the recurrent laryngeal nerve was identified and confirmed with the stimulator. This was then carefully protected as the gland was dissected away from the trachea and Berry's ligament divided. The right lobe was delivered and sent as a specimen.   The wound was then irrigated and inspected for bleeding. #10 TLS drains were placed on either side of the trachea with the drains coming out through the skin just below the wound. The drains were secured with 4-0 Vicryl suture. Surgicel was placed on either side of the trachea to control minor oozing within the wound. The strap muscles were then reapproximated with 4-0 Vicryl suture. The platysma and subcutaneous tissues were also closed with 4-0 Vicryl suture. The skin was closed with a 3-0 running subcuticular Prolene suture. The patient was then returned to the anesthesiologist for awakening. The patient was awakened and taken to recovery room  in good condition postoperatively.  The patient was then returned to the anesthesiologist in good condition for awakening. The patient was awakened  and taken to the recovery room in good condition.   Disposition:   PACU then transferred to floor  Plan: The patient is to be admitted for observation and monitoring of serum calcium, with potential discharge tomorrow if serum calcium is stable overnight.  [name] 12/17/2018 11:09 AM

## 2018-12-17 NOTE — Progress Notes (Signed)
Linda Rios, Linda Rios [id number] Jul 12, 1950 Linda Nearing, MD   SUBJECTIVE: This 68 y.o. year old female is status post THYROIDECTOMY. Doing well, tolerating a soft diet. Reasonable pain control  Medications:  Current Facility-Administered Medications  Medication Dose Route Frequency Provider Last Rate Last Dose  . acetaminophen (TYLENOL) solution 650 mg  650 mg Oral Q6H PRN Clyde Canterbury, MD      . bacitracin ointment 1 application  1 application Topical [id number] Clyde Canterbury, MD   1 application at [id number]  . calcium-vitamin D (OSCAL WITH D) 500-200 MG-UNIT per tablet 2 tablet  2 tablet Oral BID Clyde Canterbury, MD   2 tablet at 12/17/18 1412  . dextrose 5 % and 0.45 % NaCl with KCl 40 mEq/L infusion   Intravenous Continuous Clyde Canterbury, MD 100 mL/hr at 12/17/18 1700    . docusate sodium (COLACE) capsule 100 mg  100 mg Oral BID Clyde Canterbury, MD   100 mg at 12/17/18 1412  . HYDROcodone-acetaminophen (NORCO/VICODIN) 5-325 MG per tablet 1-2 tablet  1-2 tablet Oral Q4H PRN Clyde Canterbury, MD   2 tablet at 12/17/18 1817  . morphine 2 MG/ML injection 2-4 mg  2-4 mg Intravenous Q2H PRN Clyde Canterbury, MD      . ondansetron [facility]) tablet 4 mg  4 mg Oral Q4H PRN Clyde Canterbury, MD       Or  . ondansetron [facility]) injection 4 mg  4 mg Intravenous Q4H PRN Clyde Canterbury, MD      .  Medications Prior to Admission  Medication Sig Dispense Refill  . acetaminophen (TYLENOL) 500 MG tablet Take 1,000 mg by mouth 2 (two) times daily as needed for mild pain (For Mild Pain).     Marland Kitchen amLODipine (NORVASC) 5 MG tablet Take 5 mg by mouth daily.    Marland Kitchen aspirin EC 81 MG tablet Take 81 mg by mouth daily.    . Calcium Carb-Cholecalciferol (CALCIUM 600+D) 600-800 MG-UNIT TABS Take 1 tablet by mouth 2 (two) times daily.     . diphenhydramine-acetaminophen (TYLENOL PM) 25-500 MG TABS Take 2 tablets by mouth at bedtime.     . Glucosamine-Chondroit-Vit C-Mn (GLUCOSAMINE CHONDR 1500 COMPLX PO) Take 1 tablet by mouth 2 (two) times  daily.    . hydrochlorothiazide (HYDRODIURIL) 12.5 MG tablet Take 12.5 mg by mouth daily.    Marland Kitchen lovastatin (MEVACOR) 20 MG tablet Take 20 mg by mouth at bedtime.    . methylcellulose (CITRUCEL) oral powder Take 1 packet by mouth daily.     . Multiple Vitamins-Minerals (ONE DAILY FOR WOMEN) TABS Take 1 tablet by mouth daily.    Marland Kitchen omeprazole (PRILOSEC) 40 MG capsule Take 40 mg by mouth daily.      OBJECTIVE:  PHYSICAL EXAM  Vitals: Blood pressure 136/81, pulse 86, temperature 98.2 F (36.8 C), temperature source Oral, resp. rate 17, height 5\' 6"  (1.676 m), weight 120.2 kg, SpO2 94 %.. General: Well-developed, Well-nourished in no acute distress Mood: Mood and affect well adjusted, pleasant and cooperative. Orientation: Grossly alert and oriented. Vocal Quality: No hoarseness. Communicates verbally. head and Face: NCAT. No facial asymmetry. No visible skin lesions. No significant facial scars. No tenderness with sinus percussion. Facial strength normal and symmetric. Neck: Wound clean, dry, intact with no swelling or redness. No hematoma Respiratory: Normal respiratory effort without labored breathing.  MEDICAL DECISION MAKING: Data Review:  Results for orders placed or performed during the hospital encounter of 12/17/18 (from the past 48 hour(s))  Calcium  Status: None   Collection Time: 12/17/18  3:49 PM  Result Value Ref Range   Calcium 9.0 8.9 - 10.3 mg/dL    Comment: Performed at [facility], [address] [place], [place] [address]  . No results found..   ASSESSMENT: Doing well s/p total thyroidectomy\  PLAN: Start levothryoxine. Continue calcium supplement. Likely d/c in AM after drain removal   Linda Nearing, MD 12/17/2018 6:18 PMPatient ID: Linda Rios, female   DOB: 11-29-1950, 68 y.o.   MRN: [id number]

## 2018-12-17 NOTE — H&P (Signed)
History and physical reviewed and will be scanned in later. No change in medical status reported by the patient or family, appears stable for surgery. All questions regarding the procedure answered, and patient (or family if a child) expressed understanding of the procedure. ? ?Linda Rios S Linda Rios ?@TODAY@ ?

## 2018-12-17 NOTE — Anesthesia Post-op Follow-up Note (Signed)
Anesthesia QCDR form completed.        

## 2018-12-17 NOTE — Anesthesia Procedure Notes (Signed)
Procedure Name: Intubation Performed by: Demetrius Charity, CRNA Pre-anesthesia Checklist: Patient identified, Patient being monitored, Timeout performed, Emergency Drugs available and Suction available Patient Re-evaluated:Patient Re-evaluated prior to induction Oxygen Delivery Method: Circle system utilized Preoxygenation: Pre-oxygenation with 100% oxygen Induction Type: IV induction Ventilation: Mask ventilation without difficulty Laryngoscope Size: 3 and Glidescope Grade View: Grade II Tube type: Oral Tube size: 7.0 mm Number of attempts: 1 Airway Equipment and Method: Video-laryngoscopy and Rigid stylet Placement Confirmation: ETT inserted through vocal cords under direct vision,  positive ETCO2 and breath sounds checked- equal and bilateral Secured at: 23 cm Tube secured with: Tape Dental Injury: Teeth and Oropharynx as per pre-operative assessment

## 2018-12-17 NOTE — Anesthesia Preprocedure Evaluation (Addendum)
Anesthesia Evaluation  Patient identified by MRN, date of birth, ID band Patient awake    Reviewed: Allergy & Precautions, NPO status , Patient's Chart, lab work & pertinent test results  History of Anesthesia Complications Negative for: history of anesthetic complications  Airway Mallampati: III  TM Distance: <3 FB Neck ROM: Full    Dental  (+) Lower Dentures, Partial Upper   Pulmonary neg pulmonary ROS, neg sleep apnea, neg COPD,    breath sounds clear to auscultation- rhonchi (-) wheezing      Cardiovascular hypertension, Pt. on medications (-) CAD, (-) Past MI, (-) Cardiac Stents and (-) CABG  Rhythm:Regular Rate:Normal - Systolic murmurs and - Diastolic murmurs    Neuro/Psych neg Seizures negative neurological ROS  negative psych ROS   GI/Hepatic Neg liver ROS, GERD  ,  Endo/Other  negative endocrine ROSgoiter  Renal/GU negative Renal ROS     Musculoskeletal  (+) Arthritis ,   Abdominal (+) + obese,   Peds  Hematology negative hematology ROS (+)   Anesthesia Other Findings Past Medical History: No date: Arthritis No date: Diverticulosis No date: GERD (gastroesophageal reflux disease) No date: Goiter     Comment:  Right and left sides No date: Hyperlipidemia No date: Hyperplastic colon polyp No date: Hypertension No date: Internal hemorrhoids No date: Tubular adenoma of colon   Reproductive/Obstetrics                             Anesthesia Physical Anesthesia Plan  ASA: II  Anesthesia Plan: General   Post-op Pain Management:    Induction: Intravenous  PONV Risk Score and Plan: 2 and Ondansetron, Dexamethasone and Midazolam  Airway Management Planned: Oral ETT and Video Laryngoscope Planned  Additional Equipment:   Intra-op Plan:   Post-operative Plan: Extubation in OR  Informed Consent: I have reviewed the patients History and Physical, chart, labs and discussed  the procedure including the risks, benefits and alternatives for the proposed anesthesia with the patient or authorized representative who has indicated his/her understanding and acceptance.     Dental advisory given  Plan Discussed with: CRNA and Anesthesiologist  Anesthesia Plan Comments:         Anesthesia Quick Evaluation

## 2018-12-18 DIAGNOSIS — E669 Obesity, unspecified: Secondary | ICD-10-CM | POA: Diagnosis not present

## 2018-12-18 DIAGNOSIS — Z7982 Long term (current) use of aspirin: Secondary | ICD-10-CM | POA: Diagnosis not present

## 2018-12-18 DIAGNOSIS — Z6841 Body Mass Index (BMI) 40.0 and over, adult: Secondary | ICD-10-CM | POA: Diagnosis not present

## 2018-12-18 DIAGNOSIS — K219 Gastro-esophageal reflux disease without esophagitis: Secondary | ICD-10-CM | POA: Diagnosis not present

## 2018-12-18 DIAGNOSIS — E041 Nontoxic single thyroid nodule: Secondary | ICD-10-CM | POA: Diagnosis not present

## 2018-12-18 DIAGNOSIS — I10 Essential (primary) hypertension: Secondary | ICD-10-CM | POA: Diagnosis not present

## 2018-12-18 DIAGNOSIS — Z79899 Other long term (current) drug therapy: Secondary | ICD-10-CM | POA: Diagnosis not present

## 2018-12-18 DIAGNOSIS — M1990 Unspecified osteoarthritis, unspecified site: Secondary | ICD-10-CM | POA: Diagnosis not present

## 2018-12-18 DIAGNOSIS — E042 Nontoxic multinodular goiter: Secondary | ICD-10-CM | POA: Diagnosis not present

## 2018-12-18 LAB — CALCIUM: Calcium: 9.1 mg/dL (ref 8.9–10.3)

## 2018-12-18 MED ORDER — BACITRACIN ZINC 500 UNIT/GM EX OINT: 1.0000 "application " | TOPICAL_OINTMENT | Freq: Three times a day (TID) | CUTANEOUS | 0 refills | Status: AC

## 2018-12-18 MED ORDER — DOCUSATE SODIUM 100 MG PO CAPS: 100.0000 mg | ORAL_CAPSULE | Freq: Two times a day (BID) | ORAL | 0 refills | Status: AC

## 2018-12-18 MED ORDER — LEVOTHYROXINE SODIUM 125 MCG PO TABS: 125.0000 ug | ORAL_TABLET | Freq: Every day | ORAL | 2 refills | Status: AC

## 2018-12-18 MED ORDER — HYDROCODONE-ACETAMINOPHEN 5-325 MG PO TABS: 1.0000 | ORAL_TABLET | ORAL | 0 refills | Status: DC | PRN

## 2018-12-18 NOTE — Discharge Summary (Signed)
Patient underwent total thyroidectomy with unremarkable post-op course. Serum calcium stable throughout recovery and drains removed postop day 1. Stable for d/c hom on levothyroxine 136mcg PO QD, continue calcium supplement. Norco 5/325 1-2 every 4-6 hours prn pain. F/u 1 week

## 2018-12-18 NOTE — Care Management Obs Status (Signed)
[place] NOTIFICATION   Patient Details  Name: Linda Rios MRN: [id number] Date of Birth: 11-19-1950   Medicare Observation Status Notification Given:  Yes    Tommy Medal 12/18/2018, 4:22 PM

## 2018-12-18 NOTE — Progress Notes (Signed)
Linda Rios  A and O x 4. VSS. Pt tolerating diet well. No complaints of pain or nausea. IV removed intact, prescriptions given. Pt voiced understanding of discharge instructions with no further questions. Pt discharged via wheelchair with NT.   Allergies as of 12/18/2018      Reactions   Pravastatin Other (See Comments)   Reaction:  Headaches    Vancomycin Hives, Itching   Patient with redness, hives, and itching to her scalp.      Medication List    STOP taking these medications   acetaminophen 500 MG tablet Commonly known as: TYLENOL   aspirin EC 81 MG tablet   diphenhydramine-acetaminophen 25-500 MG Tabs tablet Commonly known as: TYLENOL PM     TAKE these medications   amLODipine 5 MG tablet Commonly known as: NORVASC Take 5 mg by mouth daily.   bacitracin ointment Apply 1 application topically every 8 (eight) hours.   Calcium 600+D 600-800 MG-UNIT Tabs Generic drug: Calcium Carb-Cholecalciferol Take 1 tablet by mouth 2 (two) times daily.   Citrucel oral powder Generic drug: methylcellulose Take 1 packet by mouth daily.   docusate sodium 100 MG capsule Commonly known as: COLACE Take 1 capsule (100 mg total) by mouth 2 (two) times daily.   GLUCOSAMINE CHONDR 1500 COMPLX PO Take 1 tablet by mouth 2 (two) times daily.   hydrochlorothiazide 12.5 MG tablet Commonly known as: HYDRODIURIL Take 12.5 mg by mouth daily.   HYDROcodone-acetaminophen 5-325 MG tablet Commonly known as: NORCO/VICODIN Take 1-2 tablets by mouth every 4 (four) hours as needed for moderate pain.   levothyroxine 125 MCG tablet Commonly known as: SYNTHROID Take 1 tablet (125 mcg total) by mouth daily at 6 (six) AM. Start taking on: December 19, 2018   lovastatin 20 MG tablet Commonly known as: MEVACOR Take 20 mg by mouth at bedtime.   omeprazole 40 MG capsule Commonly known as: PRILOSEC Take 40 mg by mouth daily.   One Daily For Women Tabs Take 1 tablet by mouth daily.        Vitals:   12/18/18 1148 12/18/18 1340  BP: 123/70 (!) 155/69  Pulse: (!) 52 (!) 57  Resp: 18 16  Temp: 97.9 F (36.6 C) 97.7 F (36.5 C)  SpO2: 96% 96%    Francesco Sor

## 2018-12-18 NOTE — Final Progress Note (Signed)
Linda Rios, Linda Rios [id number] 01/20/1951 [name], MD   SUBJECTIVE: This 68 y.o. year old female is status post total THYROIDECTOMY. Doing well with good pain control, tolerating PO diet well.  Medications:  Current Facility-Administered Medications  Medication Dose Route Frequency Provider Last Rate Last Dose  . acetaminophen (TYLENOL) solution 650 mg  650 mg Oral Q6H PRN Clyde Canterbury, MD      . amLODipine (NORVASC) tablet 5 mg  5 mg Oral Daily Clyde Canterbury, MD      . bacitracin ointment 1 application  1 application Topical [id number] Clyde Canterbury, MD   1 application at [id number] [phone]  . calcium-vitamin D (OSCAL WITH D) 500-200 MG-UNIT per tablet 2 tablet  2 tablet Oral BID Clyde Canterbury, MD   2 tablet at 12/17/18 2250  . dextrose 5 % and 0.45 % NaCl with KCl 40 mEq/L infusion   Intravenous Continuous Clyde Canterbury, MD 100 mL/hr at 12/18/18 0200    . docusate sodium (COLACE) capsule 100 mg  100 mg Oral BID Clyde Canterbury, MD   100 mg at 12/17/18 2250  . hydrochlorothiazide (MICROZIDE) capsule 12.5 mg  12.5 mg Oral Daily Clyde Canterbury, MD      . HYDROcodone-acetaminophen (NORCO/VICODIN) 5-325 MG per tablet 1-2 tablet  1-2 tablet Oral Q4H PRN Clyde Canterbury, MD   2 tablet at 12/17/18 2250  . levothyroxine (SYNTHROID) tablet 125 mcg  125 mcg Oral Q0600 Clyde Canterbury, MD   125 mcg at 12/18/18 0523  . morphine 2 MG/ML injection 2-4 mg  2-4 mg Intravenous Q2H PRN Clyde Canterbury, MD      . ondansetron [facility]) tablet 4 mg  4 mg Oral Q4H PRN Clyde Canterbury, MD       Or  . ondansetron [facility]) injection 4 mg  4 mg Intravenous Q4H PRN Clyde Canterbury, MD   4 mg at 12/17/18 2008  . pantoprazole (PROTONIX) EC tablet 40 mg  40 mg Oral Daily Clyde Canterbury, MD      .  Medications Prior to Admission  Medication Sig Dispense Refill  . acetaminophen (TYLENOL) 500 MG tablet Take 1,000 mg by mouth 2 (two) times daily as needed for mild pain (For Mild Pain).     Marland Kitchen amLODipine (NORVASC) 5 MG tablet Take 5 mg by mouth  daily.    Marland Kitchen aspirin EC 81 MG tablet Take 81 mg by mouth daily.    . Calcium Carb-Cholecalciferol (CALCIUM 600+D) 600-800 MG-UNIT TABS Take 1 tablet by mouth 2 (two) times daily.     . diphenhydramine-acetaminophen (TYLENOL PM) 25-500 MG TABS Take 2 tablets by mouth at bedtime.     . Glucosamine-Chondroit-Vit C-Mn (GLUCOSAMINE CHONDR 1500 COMPLX PO) Take 1 tablet by mouth 2 (two) times daily.    . hydrochlorothiazide (HYDRODIURIL) 12.5 MG tablet Take 12.5 mg by mouth daily.    Marland Kitchen lovastatin (MEVACOR) 20 MG tablet Take 20 mg by mouth at bedtime.    . methylcellulose (CITRUCEL) oral powder Take 1 packet by mouth daily.     . Multiple Vitamins-Minerals (ONE DAILY FOR WOMEN) TABS Take 1 tablet by mouth daily.    Marland Kitchen omeprazole (PRILOSEC) 40 MG capsule Take 40 mg by mouth daily.      OBJECTIVE:  PHYSICAL EXAM  Vitals: Blood pressure (!) 143/69, pulse (!) 59, temperature 98.2 F (36.8 C), temperature source Oral, resp. rate 18, height 5\' 6"  (1.676 m), weight 120.2 kg, SpO2 95 %.. General: Well-developed, Well-nourished in no acute distress Mood: Mood and affect well adjusted, pleasant  and cooperative. Orientation: Grossly alert and oriented. Vocal Quality: No hoarseness. Communicates verbally. head and Face: NCAT. No facial asymmetry. No visible skin lesions. No significant facial scars. No tenderness with sinus percussion. Facial strength normal and symmetric. Neck: Neck wound clean, dry, intact. Minimal swelling. No hematoma. Drains removed Respiratory: Normal respiratory effort without labored breathing.  MEDICAL DECISION MAKING: Data Review:  Results for orders placed or performed during the hospital encounter of 12/17/18 (from the past 48 hour(s))  Calcium     Status: None   Collection Time: 12/17/18  3:49 PM  Result Value Ref Range   Calcium 9.0 8.9 - 10.3 mg/dL    Comment: Performed at [facility], [address], [place], [place] [address]  Calcium     Status: None    Collection Time: 12/18/18  5:54 AM  Result Value Ref Range   Calcium 9.1 8.9 - 10.3 mg/dL    Comment: Performed at [facility], [address] [place], [place] [address]  . No results found..   ASSESSMENT: Doing well s/p total thyroidectomy. Drains removed. Calcium stable  PLAN: d/c home on levothyroxine, pain meds. Wound care discussed. F/u 1 week   [name], MD 12/18/2018 8:24 AM

## 2018-12-19 LAB — SURGICAL PATHOLOGY

## 2018-12-30 DIAGNOSIS — Z23 Encounter for immunization: Secondary | ICD-10-CM | POA: Diagnosis not present

## 2018-12-30 DIAGNOSIS — K229 Disease of esophagus, unspecified: Secondary | ICD-10-CM | POA: Diagnosis not present

## 2019-01-16 DIAGNOSIS — E042 Nontoxic multinodular goiter: Secondary | ICD-10-CM | POA: Diagnosis not present

## 2019-01-19 ENCOUNTER — Encounter: Payer: Self-pay | Admitting: Otolaryngology

## 2019-01-26 DIAGNOSIS — H5203 Hypermetropia, bilateral: Secondary | ICD-10-CM | POA: Diagnosis not present

## 2019-02-19 DIAGNOSIS — E042 Nontoxic multinodular goiter: Secondary | ICD-10-CM | POA: Diagnosis not present

## 2019-04-03 DIAGNOSIS — I10 Essential (primary) hypertension: Secondary | ICD-10-CM | POA: Diagnosis not present

## 2019-04-03 DIAGNOSIS — R350 Frequency of micturition: Secondary | ICD-10-CM | POA: Diagnosis not present

## 2019-04-03 DIAGNOSIS — E039 Hypothyroidism, unspecified: Secondary | ICD-10-CM | POA: Diagnosis not present

## 2019-04-03 DIAGNOSIS — Z6841 Body Mass Index (BMI) 40.0 and over, adult: Secondary | ICD-10-CM | POA: Diagnosis not present

## 2019-04-03 DIAGNOSIS — E782 Mixed hyperlipidemia: Secondary | ICD-10-CM | POA: Diagnosis not present

## 2019-04-03 DIAGNOSIS — Z79899 Other long term (current) drug therapy: Secondary | ICD-10-CM | POA: Diagnosis not present

## 2019-05-22 ENCOUNTER — Other Ambulatory Visit
Admission: RE | Admit: 2019-05-22 | Discharge: 2019-05-22 | Disposition: A | Discharge status: Home / Self Care | Payer: Medicare HMO | Source: Ambulatory Visit | Attending: Internal Medicine | Admitting: Internal Medicine

## 2019-05-22 ENCOUNTER — Other Ambulatory Visit: Payer: Self-pay

## 2019-05-22 DIAGNOSIS — Z20822 Contact with and (suspected) exposure to covid-19: Secondary | ICD-10-CM | POA: Diagnosis not present

## 2019-05-22 DIAGNOSIS — Z01812 Encounter for preprocedural laboratory examination: Secondary | ICD-10-CM | POA: Insufficient documentation

## 2019-05-23 LAB — SARS CORONAVIRUS 2 (TAT 6-24 HRS): SARS Coronavirus 2: NEGATIVE

## 2019-05-25 ENCOUNTER — Other Ambulatory Visit: Payer: Medicare HMO

## 2019-05-27 ENCOUNTER — Other Ambulatory Visit: Payer: Self-pay

## 2019-05-27 ENCOUNTER — Ambulatory Visit: Payer: Medicare HMO | Admitting: Certified Registered"

## 2019-05-27 ENCOUNTER — Encounter: Payer: Self-pay | Admitting: Internal Medicine

## 2019-05-27 ENCOUNTER — Ambulatory Visit
Admission: RE | Admit: 2019-05-27 | Discharge: 2019-05-27 | Disposition: A | Discharge status: Home / Self Care | Payer: Medicare HMO | Attending: Internal Medicine | Admitting: Internal Medicine

## 2019-05-27 ENCOUNTER — Encounter
Admission: RE | Disposition: A | Discharge status: Home / Self Care | Payer: Self-pay | Source: Home / Self Care | Attending: Internal Medicine

## 2019-05-27 DIAGNOSIS — Z6841 Body Mass Index (BMI) 40.0 and over, adult: Secondary | ICD-10-CM | POA: Diagnosis not present

## 2019-05-27 DIAGNOSIS — Z96652 Presence of left artificial knee joint: Secondary | ICD-10-CM | POA: Diagnosis not present

## 2019-05-27 DIAGNOSIS — Z79890 Hormone replacement therapy (postmenopausal): Secondary | ICD-10-CM | POA: Insufficient documentation

## 2019-05-27 DIAGNOSIS — E785 Hyperlipidemia, unspecified: Secondary | ICD-10-CM | POA: Diagnosis not present

## 2019-05-27 DIAGNOSIS — Q438 Other specified congenital malformations of intestine: Secondary | ICD-10-CM | POA: Insufficient documentation

## 2019-05-27 DIAGNOSIS — K228 Other specified diseases of esophagus: Secondary | ICD-10-CM | POA: Diagnosis not present

## 2019-05-27 DIAGNOSIS — I10 Essential (primary) hypertension: Secondary | ICD-10-CM | POA: Diagnosis not present

## 2019-05-27 DIAGNOSIS — K219 Gastro-esophageal reflux disease without esophagitis: Secondary | ICD-10-CM | POA: Diagnosis not present

## 2019-05-27 DIAGNOSIS — K2100 Gastro-esophageal reflux disease with esophagitis, without bleeding: Secondary | ICD-10-CM | POA: Diagnosis present

## 2019-05-27 DIAGNOSIS — I8500 Esophageal varices without bleeding: Secondary | ICD-10-CM | POA: Diagnosis not present

## 2019-05-27 DIAGNOSIS — Z79899 Other long term (current) drug therapy: Secondary | ICD-10-CM | POA: Insufficient documentation

## 2019-05-27 DIAGNOSIS — Z8719 Personal history of other diseases of the digestive system: Secondary | ICD-10-CM | POA: Insufficient documentation

## 2019-05-27 DIAGNOSIS — K5790 Diverticulosis of intestine, part unspecified, without perforation or abscess without bleeding: Secondary | ICD-10-CM | POA: Diagnosis not present

## 2019-05-27 HISTORY — PX: ESOPHAGOGASTRODUODENOSCOPY (EGD) WITH PROPOFOL: SHX5813

## 2019-05-27 SURGERY — ESOPHAGOGASTRODUODENOSCOPY (EGD) WITH PROPOFOL
Anesthesia: General

## 2019-05-27 MED ORDER — SODIUM CHLORIDE 0.9 % IV SOLN: INTRAVENOUS | Status: DC

## 2019-05-27 MED ORDER — PROPOFOL 10 MG/ML IV BOLUS
INTRAVENOUS | Status: DC | PRN
  Administered 2019-05-27: 50 mg via INTRAVENOUS

## 2019-05-27 MED ORDER — PROPOFOL 500 MG/50ML IV EMUL
INTRAVENOUS | Status: DC | PRN
  Administered 2019-05-27: 155 ug/kg/min via INTRAVENOUS

## 2019-05-27 MED ORDER — GLYCOPYRROLATE 0.2 MG/ML IJ SOLN
INTRAMUSCULAR | Status: DC | PRN
  Administered 2019-05-27: .2 mg via INTRAVENOUS

## 2019-05-27 MED ORDER — LIDOCAINE HCL (CARDIAC) PF 100 MG/5ML IV SOSY
PREFILLED_SYRINGE | INTRAVENOUS | Status: DC | PRN
  Administered 2019-05-27: 100 mg via INTRATRACHEAL

## 2019-05-27 NOTE — Transfer of Care (Signed)
Immediate Anesthesia Transfer of Care Note  Patient: Linda Rios  Procedure(s) Performed: ESOPHAGOGASTRODUODENOSCOPY (EGD) WITH PROPOFOL (N/A )  Patient Location: Endoscopy Unit  Anesthesia Type:General  Level of Consciousness: awake, alert  and patient cooperative  Airway & Oxygen Therapy: Patient Spontanous Breathing and Patient connected to face mask oxygen  Post-op Assessment: Report given to RN and Post -op Vital signs reviewed and stable  Post vital signs: Reviewed and stable  Last Vitals:  Vitals Value Taken Time  BP 131/82 05/27/19 1305  Temp    Pulse 78 05/27/19 1306  Resp 24 05/27/19 1306  SpO2 100 % 05/27/19 1306  Vitals shown include unvalidated device data.  Last Pain:  Vitals:   05/27/19 1217  TempSrc: Temporal  PainSc: 0-No pain         Complications: No apparent anesthesia complications

## 2019-05-27 NOTE — Op Note (Signed)
[facility] Patient Name: Linda Rios Procedure Date: 05/27/2019 12:22 PM MRN: [id number] Account #: [id number] Date of Birth: 11-28-1950 Admit Type: Outpatient Age: 69 Room: [facility] ENDO ROOM 3 Gender: Female Note Status: Finalized Procedure:             Upper GI endoscopy Indications:           Follow-up of reflux esophagitis, history of esophageal                         nodule Providers:             Lorie Apley K. Alice Reichert MD, MD Referring MD:          Philmore Pali (Referring MD) Medicines:             Propofol per Anesthesia Complications:         No immediate complications. Procedure:             Pre-Anesthesia Assessment:                        - The risks and benefits of the procedure and the                         sedation options and risks were discussed with the                         patient. All questions were answered and informed                         consent was obtained.                        - Patient identification and proposed procedure were                         verified prior to the procedure by the nurse. The                         procedure was verified in the procedure room.                        - ASA Grade Assessment: III - A patient with severe                         systemic disease.                        - After reviewing the risks and benefits, the patient                         was deemed in satisfactory condition to undergo the                         procedure.                        After obtaining informed consent, the endoscope was                         passed under direct vision. Throughout the procedure,  the patient's blood pressure, pulse, and oxygen                         saturations were monitored continuously. The Endoscope                         was introduced through the mouth, and advanced to the                         third part of duodenum. The upper GI endoscopy was                          accomplished without difficulty. The patient tolerated                         the procedure well. Findings:      The examined esophagus was mildly tortuous.      Prominent esophageal veins present, without clear varices present,       Vascular prominence most notable in the proximal esophagus. No evidence       of nodule as noted on previous procedure.      The stomach was normal.      The examined duodenum was normal.      The cardia and gastric fundus were normal on retroflexion.      The exam was otherwise without abnormality. Impression:            - Tortuous esophagus.                        - Normal stomach.                        - Normal examined duodenum.                        - The examination was otherwise normal.                        - No specimens collected. Recommendation:        - Proceed with colonoscopy Procedure Code(s):     --- Professional ---                        [phone], Esophagogastroduodenoscopy, flexible,                         transoral; diagnostic, including collection of                         specimen(s) by brushing or washing, when performed                         (separate procedure) Diagnosis Code(s):     --- Professional ---                        K21.00, Gastro-esophageal reflux disease with                         esophagitis, without bleeding  Q39.9, Congenital malformation of esophagus,                         unspecified CPT copyright 2019 [employer]. All rights reserved. The codes documented in this report are preliminary and upon coder review may  be revised to meet current compliance requirements. Efrain Sella MD, MD 05/27/2019 1:06:49 PM This report has been signed electronically. Number of Addenda: 0 Note Initiated On: 05/27/2019 12:22 PM Estimated Blood Loss:  Estimated blood loss: none.      [facility]

## 2019-05-27 NOTE — Anesthesia Preprocedure Evaluation (Signed)
Anesthesia Evaluation  Patient identified by MRN, date of birth, ID band Patient awake    Reviewed: Allergy & Precautions, H&P , NPO status , Patient's Chart, lab work & pertinent test results, reviewed documented beta blocker date and time   Airway Mallampati: II   Neck ROM: full    Dental  (+) Poor Dentition   Pulmonary neg pulmonary ROS,    Pulmonary exam normal        Cardiovascular Exercise Tolerance: Good hypertension, On Medications negative cardio ROS Normal cardiovascular exam Rhythm:regular Rate:Normal     Neuro/Psych negative neurological ROS  negative psych ROS   GI/Hepatic Neg liver ROS, GERD  ,  Endo/Other  Morbid obesity  Renal/GU negative Renal ROS  negative genitourinary   Musculoskeletal  (+) Arthritis , Osteoarthritis,    Abdominal   Peds  Hematology negative hematology ROS (+)   Anesthesia Other Findings Past Medical History: No date: Arthritis No date: Diverticulosis No date: Goiter     Comment:  Right and left sides No date: Hyperlipidemia No date: Hyperplastic colon polyp No date: Hypertension No date: Internal hemorrhoids No date: Tubular adenoma of colon Past Surgical History: No date: APPENDECTOMY No date: CHOLECYSTECTOMY No date: COLONOSCOPY No date: FOOT SURGERY; Right No date: KNEE ARTHROSCOPY; Left 09/29/2014: TOTAL KNEE ARTHROPLASTY; Left     Comment:  Procedure: TOTAL KNEE ARTHROPLASTY;  Surgeon: Leanor Kail, MD;  Location: [facility] ORS;  Service: Orthopedics;              Laterality: Left; BMI    Body Mass Index: 42.77 kg/m     Reproductive/Obstetrics negative OB ROS                             Anesthesia Physical  Anesthesia Plan  ASA: III  Anesthesia Plan: General   Post-op Pain Management:    Induction:   PONV Risk Score and Plan:   Airway Management Planned: Nasal Cannula  Additional Equipment:   Intra-op  Plan:   Post-operative Plan:   Informed Consent: I have reviewed the patients History and Physical, chart, labs and discussed the procedure including the risks, benefits and alternatives for the proposed anesthesia with the patient or authorized representative who has indicated his/her understanding and acceptance.     Dental Advisory Given  Plan Discussed with: CRNA  Anesthesia Plan Comments:         Anesthesia Quick Evaluation

## 2019-05-27 NOTE — H&P (Signed)
Outpatient short stay form Pre-procedure 05/27/2019 11:23 AM Linda Rios K. Alice Reichert, M.D.  Primary Physician: Charlott Holler, NP  Reason for visit: Esophagitis, esophageal nodule  History of present illness: 68 year old female presents for follow-up with history of esophagitis with esophageal nodule, status post EGD in 2020 with requested follow-up.  No dysphagia, weight loss, anorexia, abdominal pain, or melena.   No current facility-administered medications for this encounter.  Current Outpatient Medications:  .  amLODipine (NORVASC) 5 MG tablet, Take 5 mg by mouth daily., Disp: , Rfl:  .  bacitracin ointment, Apply 1 application topically every 8 (eight) hours., Disp: 120 g, Rfl: 0 .  Calcium Carb-Cholecalciferol (CALCIUM 600+D) 600-800 MG-UNIT TABS, Take 1 tablet by mouth 2 (two) times daily. , Disp: , Rfl:  .  docusate sodium (COLACE) 100 MG capsule, Take 1 capsule (100 mg total) by mouth 2 (two) times daily., Disp: 10 capsule, Rfl: 0 .  Glucosamine-Chondroit-Vit C-Mn (GLUCOSAMINE CHONDR 1500 COMPLX PO), Take 1 tablet by mouth 2 (two) times daily., Disp: , Rfl:  .  hydrochlorothiazide (HYDRODIURIL) 12.5 MG tablet, Take 12.5 mg by mouth daily., Disp: , Rfl:  .  HYDROcodone-acetaminophen (NORCO/VICODIN) 5-325 MG tablet, Take 1-2 tablets by mouth every 4 (four) hours as needed for moderate pain., Disp: 30 tablet, Rfl: 0 .  levothyroxine (SYNTHROID) 125 MCG tablet, Take 1 tablet (125 mcg total) by mouth daily at 6 (six) AM., Disp: 30 tablet, Rfl: 2 .  lovastatin (MEVACOR) 20 MG tablet, Take 20 mg by mouth at bedtime., Disp: , Rfl:  .  methylcellulose (CITRUCEL) oral powder, Take 1 packet by mouth daily. , Disp: , Rfl:  .  Multiple Vitamins-Minerals (ONE DAILY FOR WOMEN) TABS, Take 1 tablet by mouth daily., Disp: , Rfl:  .  omeprazole (PRILOSEC) 40 MG capsule, Take 40 mg by mouth daily., Disp: , Rfl:   No medications prior to admission.     Allergies  Allergen Reactions  . Pravastatin Other (See  Comments)    Reaction:  Headaches   . Vancomycin Hives and Itching    Patient with redness, hives, and itching to her scalp.     Past Medical History:  Diagnosis Date  . Arthritis   . Diverticulosis   . GERD (gastroesophageal reflux disease)   . Goiter    Right and left sides  . Hyperlipidemia   . Hyperplastic colon polyp   . Hypertension   . Internal hemorrhoids   . Tubular adenoma of colon     Review of systems:  Otherwise negative.    Physical Exam  Gen: Alert, oriented. Appears stated age.  HEENT: [place]/AT. PERRLA. Lungs: CTA, no wheezes. CV: RR nl S1, S2. Abd: soft, benign, no masses. BS+ Ext: No edema. Pulses 2+    Planned procedures: Proceed with EGD. The patient understands the nature of the planned procedure, indications, risks, alternatives and potential complications including but not limited to bleeding, infection, perforation, damage to internal organs and possible oversedation/side effects from anesthesia. The patient agrees and gives consent to proceed.  Please refer to procedure notes for findings, recommendations and patient disposition/instructions.     Etheline Geppert K. Alice Reichert, M.D. Gastroenterology 05/27/2019  11:23 AM

## 2019-05-28 ENCOUNTER — Encounter: Payer: Self-pay | Admitting: *Deleted

## 2019-05-28 ENCOUNTER — Ambulatory Visit: Payer: Self-pay | Admitting: Urology

## 2019-05-28 NOTE — Anesthesia Postprocedure Evaluation (Signed)
Anesthesia Post Note  Patient: Linda Rios  Procedure(s) Performed: ESOPHAGOGASTRODUODENOSCOPY (EGD) WITH PROPOFOL (N/A )  Patient location during evaluation: Endoscopy Anesthesia Type: General Level of consciousness: awake and alert and oriented Pain management: pain level controlled Vital Signs Assessment: post-procedure vital signs reviewed and stable Respiratory status: spontaneous breathing Cardiovascular status: blood pressure returned to baseline Anesthetic complications: no     Last Vitals:  Vitals:   05/27/19 1324 05/27/19 1325  BP: 130/87 130/87  Pulse: 74 74  Resp: 18 19  Temp:    SpO2: 97% 100%    Last Pain:  Vitals:   05/28/19 0742  TempSrc:   PainSc: 0-No pain                 [name]

## 2019-05-29 DIAGNOSIS — Z6841 Body Mass Index (BMI) 40.0 and over, adult: Secondary | ICD-10-CM | POA: Diagnosis not present

## 2019-05-29 DIAGNOSIS — R062 Wheezing: Secondary | ICD-10-CM | POA: Diagnosis not present

## 2019-08-27 DIAGNOSIS — K219 Gastro-esophageal reflux disease without esophagitis: Secondary | ICD-10-CM | POA: Diagnosis not present

## 2019-08-31 DIAGNOSIS — Z6841 Body Mass Index (BMI) 40.0 and over, adult: Secondary | ICD-10-CM | POA: Diagnosis not present

## 2019-08-31 DIAGNOSIS — E785 Hyperlipidemia, unspecified: Secondary | ICD-10-CM | POA: Diagnosis not present

## 2019-08-31 DIAGNOSIS — Z1231 Encounter for screening mammogram for malignant neoplasm of breast: Secondary | ICD-10-CM | POA: Diagnosis not present

## 2019-08-31 DIAGNOSIS — Z1331 Encounter for screening for depression: Secondary | ICD-10-CM | POA: Diagnosis not present

## 2019-08-31 DIAGNOSIS — Z9181 History of falling: Secondary | ICD-10-CM | POA: Diagnosis not present

## 2019-08-31 DIAGNOSIS — Z0000 Encounter for general adult medical examination without abnormal findings: Secondary | ICD-10-CM | POA: Diagnosis not present

## 2019-09-16 DIAGNOSIS — Z1231 Encounter for screening mammogram for malignant neoplasm of breast: Secondary | ICD-10-CM | POA: Diagnosis not present

## 2019-10-02 DIAGNOSIS — E039 Hypothyroidism, unspecified: Secondary | ICD-10-CM | POA: Diagnosis not present

## 2019-10-02 DIAGNOSIS — R350 Frequency of micturition: Secondary | ICD-10-CM | POA: Diagnosis not present

## 2019-10-02 DIAGNOSIS — E782 Mixed hyperlipidemia: Secondary | ICD-10-CM | POA: Diagnosis not present

## 2019-10-02 DIAGNOSIS — Z79899 Other long term (current) drug therapy: Secondary | ICD-10-CM | POA: Diagnosis not present

## 2019-10-02 DIAGNOSIS — Z6841 Body Mass Index (BMI) 40.0 and over, adult: Secondary | ICD-10-CM | POA: Diagnosis not present

## 2019-10-02 DIAGNOSIS — I10 Essential (primary) hypertension: Secondary | ICD-10-CM | POA: Diagnosis not present

## 2020-02-25 DIAGNOSIS — M25512 Pain in left shoulder: Secondary | ICD-10-CM | POA: Diagnosis not present

## 2020-02-25 DIAGNOSIS — M75102 Unspecified rotator cuff tear or rupture of left shoulder, not specified as traumatic: Secondary | ICD-10-CM | POA: Diagnosis not present

## 2020-03-08 DIAGNOSIS — H524 Presbyopia: Secondary | ICD-10-CM | POA: Diagnosis not present

## 2020-04-04 DIAGNOSIS — E039 Hypothyroidism, unspecified: Secondary | ICD-10-CM | POA: Diagnosis not present

## 2020-04-04 DIAGNOSIS — R350 Frequency of micturition: Secondary | ICD-10-CM | POA: Diagnosis not present

## 2020-04-04 DIAGNOSIS — E782 Mixed hyperlipidemia: Secondary | ICD-10-CM | POA: Diagnosis not present

## 2020-04-04 DIAGNOSIS — Z6841 Body Mass Index (BMI) 40.0 and over, adult: Secondary | ICD-10-CM | POA: Diagnosis not present

## 2020-04-04 DIAGNOSIS — I10 Essential (primary) hypertension: Secondary | ICD-10-CM | POA: Diagnosis not present

## 2020-04-04 DIAGNOSIS — H269 Unspecified cataract: Secondary | ICD-10-CM | POA: Diagnosis not present

## 2020-04-04 DIAGNOSIS — Z139 Encounter for screening, unspecified: Secondary | ICD-10-CM | POA: Diagnosis not present

## 2020-04-12 DIAGNOSIS — H25013 Cortical age-related cataract, bilateral: Secondary | ICD-10-CM | POA: Diagnosis not present

## 2020-04-12 DIAGNOSIS — H25043 Posterior subcapsular polar age-related cataract, bilateral: Secondary | ICD-10-CM | POA: Diagnosis not present

## 2020-04-12 DIAGNOSIS — H18413 Arcus senilis, bilateral: Secondary | ICD-10-CM | POA: Diagnosis not present

## 2020-04-12 DIAGNOSIS — H2512 Age-related nuclear cataract, left eye: Secondary | ICD-10-CM | POA: Diagnosis not present

## 2020-04-12 DIAGNOSIS — H2513 Age-related nuclear cataract, bilateral: Secondary | ICD-10-CM | POA: Diagnosis not present

## 2020-04-25 DIAGNOSIS — H2512 Age-related nuclear cataract, left eye: Secondary | ICD-10-CM | POA: Diagnosis not present

## 2020-04-26 DIAGNOSIS — H2511 Age-related nuclear cataract, right eye: Secondary | ICD-10-CM | POA: Diagnosis not present

## 2020-05-16 DIAGNOSIS — H2511 Age-related nuclear cataract, right eye: Secondary | ICD-10-CM | POA: Diagnosis not present

## 2020-05-17 DIAGNOSIS — E782 Mixed hyperlipidemia: Secondary | ICD-10-CM | POA: Diagnosis not present

## 2020-05-17 DIAGNOSIS — E039 Hypothyroidism, unspecified: Secondary | ICD-10-CM | POA: Diagnosis not present

## 2020-09-05 DIAGNOSIS — E669 Obesity, unspecified: Secondary | ICD-10-CM | POA: Diagnosis not present

## 2020-09-05 DIAGNOSIS — E785 Hyperlipidemia, unspecified: Secondary | ICD-10-CM | POA: Diagnosis not present

## 2020-09-05 DIAGNOSIS — Z1331 Encounter for screening for depression: Secondary | ICD-10-CM | POA: Diagnosis not present

## 2020-09-05 DIAGNOSIS — Z0000 Encounter for general adult medical examination without abnormal findings: Secondary | ICD-10-CM | POA: Diagnosis not present

## 2020-09-05 DIAGNOSIS — Z9181 History of falling: Secondary | ICD-10-CM | POA: Diagnosis not present

## 2020-09-09 DIAGNOSIS — Z96652 Presence of left artificial knee joint: Secondary | ICD-10-CM | POA: Diagnosis not present

## 2020-09-21 DIAGNOSIS — Z1231 Encounter for screening mammogram for malignant neoplasm of breast: Secondary | ICD-10-CM | POA: Diagnosis not present

## 2020-09-22 DIAGNOSIS — G8929 Other chronic pain: Secondary | ICD-10-CM | POA: Diagnosis not present

## 2020-09-22 DIAGNOSIS — M6281 Muscle weakness (generalized): Secondary | ICD-10-CM | POA: Diagnosis not present

## 2020-09-22 DIAGNOSIS — M25562 Pain in left knee: Secondary | ICD-10-CM | POA: Diagnosis not present

## 2020-09-22 DIAGNOSIS — M25662 Stiffness of left knee, not elsewhere classified: Secondary | ICD-10-CM | POA: Diagnosis not present

## 2020-09-27 DIAGNOSIS — G8929 Other chronic pain: Secondary | ICD-10-CM | POA: Diagnosis not present

## 2020-09-27 DIAGNOSIS — M25562 Pain in left knee: Secondary | ICD-10-CM | POA: Diagnosis not present

## 2020-10-03 DIAGNOSIS — M25562 Pain in left knee: Secondary | ICD-10-CM | POA: Diagnosis not present

## 2020-10-03 DIAGNOSIS — G8929 Other chronic pain: Secondary | ICD-10-CM | POA: Diagnosis not present

## 2020-10-12 DIAGNOSIS — G8929 Other chronic pain: Secondary | ICD-10-CM | POA: Diagnosis not present

## 2020-10-12 DIAGNOSIS — M25562 Pain in left knee: Secondary | ICD-10-CM | POA: Diagnosis not present

## 2020-10-14 ENCOUNTER — Other Ambulatory Visit: Payer: Self-pay | Admitting: Nurse Practitioner

## 2020-10-14 DIAGNOSIS — E2839 Other primary ovarian failure: Secondary | ICD-10-CM

## 2020-10-14 DIAGNOSIS — E782 Mixed hyperlipidemia: Secondary | ICD-10-CM | POA: Diagnosis not present

## 2020-10-14 DIAGNOSIS — H269 Unspecified cataract: Secondary | ICD-10-CM | POA: Diagnosis not present

## 2020-10-14 DIAGNOSIS — Z6841 Body Mass Index (BMI) 40.0 and over, adult: Secondary | ICD-10-CM | POA: Diagnosis not present

## 2020-10-14 DIAGNOSIS — M25562 Pain in left knee: Secondary | ICD-10-CM | POA: Diagnosis not present

## 2020-10-14 DIAGNOSIS — E039 Hypothyroidism, unspecified: Secondary | ICD-10-CM | POA: Diagnosis not present

## 2020-10-14 DIAGNOSIS — G8929 Other chronic pain: Secondary | ICD-10-CM | POA: Diagnosis not present

## 2020-10-14 DIAGNOSIS — R350 Frequency of micturition: Secondary | ICD-10-CM | POA: Diagnosis not present

## 2020-10-14 DIAGNOSIS — I10 Essential (primary) hypertension: Secondary | ICD-10-CM | POA: Diagnosis not present

## 2020-10-18 DIAGNOSIS — G8929 Other chronic pain: Secondary | ICD-10-CM | POA: Diagnosis not present

## 2020-10-18 DIAGNOSIS — M25562 Pain in left knee: Secondary | ICD-10-CM | POA: Diagnosis not present

## 2020-10-19 ENCOUNTER — Other Ambulatory Visit: Payer: Self-pay

## 2020-10-19 ENCOUNTER — Ambulatory Visit
Admission: RE | Admit: 2020-10-19 | Discharge: 2020-10-19 | Disposition: A | Discharge status: Home / Self Care | Payer: Medicare HMO | Source: Ambulatory Visit | Attending: Nurse Practitioner | Admitting: Nurse Practitioner

## 2020-10-19 DIAGNOSIS — M8588 Other specified disorders of bone density and structure, other site: Secondary | ICD-10-CM | POA: Diagnosis not present

## 2020-10-19 DIAGNOSIS — Z780 Asymptomatic menopausal state: Secondary | ICD-10-CM | POA: Diagnosis not present

## 2020-10-19 DIAGNOSIS — E2839 Other primary ovarian failure: Secondary | ICD-10-CM | POA: Diagnosis not present

## 2020-10-19 DIAGNOSIS — M810 Age-related osteoporosis without current pathological fracture: Secondary | ICD-10-CM | POA: Diagnosis not present

## 2020-10-25 DIAGNOSIS — M25562 Pain in left knee: Secondary | ICD-10-CM | POA: Diagnosis not present

## 2020-10-25 DIAGNOSIS — G8929 Other chronic pain: Secondary | ICD-10-CM | POA: Diagnosis not present

## 2020-11-03 DIAGNOSIS — M25562 Pain in left knee: Secondary | ICD-10-CM | POA: Diagnosis not present

## 2020-11-03 DIAGNOSIS — G8929 Other chronic pain: Secondary | ICD-10-CM | POA: Diagnosis not present

## 2020-11-07 DIAGNOSIS — G8929 Other chronic pain: Secondary | ICD-10-CM | POA: Diagnosis not present

## 2020-11-07 DIAGNOSIS — M25562 Pain in left knee: Secondary | ICD-10-CM | POA: Diagnosis not present

## 2020-11-23 DIAGNOSIS — M25562 Pain in left knee: Secondary | ICD-10-CM | POA: Diagnosis not present

## 2020-11-23 DIAGNOSIS — G8929 Other chronic pain: Secondary | ICD-10-CM | POA: Diagnosis not present

## 2020-11-24 DIAGNOSIS — M791 Myalgia, unspecified site: Secondary | ICD-10-CM | POA: Diagnosis not present

## 2020-11-24 DIAGNOSIS — M810 Age-related osteoporosis without current pathological fracture: Secondary | ICD-10-CM | POA: Diagnosis not present

## 2020-11-24 DIAGNOSIS — M545 Low back pain, unspecified: Secondary | ICD-10-CM | POA: Diagnosis not present

## 2020-11-30 DIAGNOSIS — G8929 Other chronic pain: Secondary | ICD-10-CM | POA: Diagnosis not present

## 2020-11-30 DIAGNOSIS — M25562 Pain in left knee: Secondary | ICD-10-CM | POA: Diagnosis not present

## 2020-12-07 DIAGNOSIS — M25562 Pain in left knee: Secondary | ICD-10-CM | POA: Diagnosis not present

## 2020-12-07 DIAGNOSIS — G8929 Other chronic pain: Secondary | ICD-10-CM | POA: Diagnosis not present

## 2020-12-22 ENCOUNTER — Ambulatory Visit (INDEPENDENT_AMBULATORY_CARE_PROVIDER_SITE_OTHER): Payer: Medicare HMO | Admitting: Urology

## 2020-12-22 ENCOUNTER — Other Ambulatory Visit: Payer: Self-pay

## 2020-12-22 ENCOUNTER — Encounter: Payer: Self-pay | Admitting: Urology

## 2020-12-22 VITALS — BP 136/77 | HR 66 | Ht 66.0 in | Wt 271.0 lb

## 2020-12-22 DIAGNOSIS — R350 Frequency of micturition: Secondary | ICD-10-CM | POA: Diagnosis not present

## 2020-12-22 DIAGNOSIS — N3281 Overactive bladder: Secondary | ICD-10-CM

## 2020-12-22 LAB — URINALYSIS, COMPLETE
Bilirubin, UA: NEGATIVE
Glucose, UA: NEGATIVE
Ketones, UA: NEGATIVE
Leukocytes,UA: NEGATIVE
Nitrite, UA: NEGATIVE
Protein,UA: NEGATIVE
Specific Gravity, UA: 1.02 (ref 1.005–1.030)
Urobilinogen, Ur: 0.2 mg/dL (ref 0.2–1.0)
pH, UA: 6 (ref 5.0–7.5)

## 2020-12-22 LAB — MICROSCOPIC EXAMINATION
Epithelial Cells (non renal): NONE SEEN /hpf (ref 0–10)
RBC, Urine: NONE SEEN /hpf (ref 0–2)

## 2020-12-22 LAB — BLADDER SCAN AMB NON-IMAGING

## 2020-12-22 MED ORDER — MIRABEGRON ER 50 MG PO TB24: 50.0000 mg | ORAL_TABLET | Freq: Every day | ORAL | 0 refills | Status: DC

## 2020-12-22 NOTE — Patient Instructions (Signed)

## 2020-12-22 NOTE — Progress Notes (Signed)
12/22/20 10:27 AM   Linda Rios 01/18/1951 [id number]  CC: Urinary urgency/frequency  HPI: For 70 year old female on Lasix who reports a long history of urinary urgency, frequency, nocturia 2-3 times per night, and occasional urge incontinence.  She drinks primarily water during the day, and tea in the evening.  She previously was tried on oxybutynin for a year with no improvement in her symptoms.  Urinalysis today is completely benign, and PVR is normal at 53 mL.  She denies significant stress incontinence.  She denies any gross hematuria or dysuria.   PMH: Past Medical History:  Diagnosis Date   Arthritis    Diverticulosis    GERD (gastroesophageal reflux disease)    Goiter    Right and left sides   Hyperlipidemia    Hyperplastic colon polyp    Hypertension    Internal hemorrhoids    Tubular adenoma of colon     Surgical History: Past Surgical History:  Procedure Laterality Date   APPENDECTOMY     CHOLECYSTECTOMY     COLONOSCOPY     COLONOSCOPY WITH PROPOFOL N/A 11/10/2018   Procedure: COLONOSCOPY WITH PROPOFOL;  Surgeon: Lollie Sails, MD;  Location: [facility] ENDOSCOPY;  Service: Endoscopy;  Laterality: N/A;   ESOPHAGOGASTRODUODENOSCOPY (EGD) WITH PROPOFOL N/A 11/10/2018   Procedure: ESOPHAGOGASTRODUODENOSCOPY (EGD) WITH PROPOFOL;  Surgeon: Lollie Sails, MD;  Location: [facility] ENDOSCOPY;  Service: Endoscopy;  Laterality: N/A;   ESOPHAGOGASTRODUODENOSCOPY (EGD) WITH PROPOFOL N/A 05/27/2019   Procedure: ESOPHAGOGASTRODUODENOSCOPY (EGD) WITH PROPOFOL;  Surgeon: Toledo, Benay Pike, MD;  Location: [facility] ENDOSCOPY;  Service: Gastroenterology;  Laterality: N/A;   FOOT SURGERY Right    KNEE ARTHROSCOPY Left    THYROIDECTOMY Bilateral 12/17/2018   Procedure: TOTAL THYROIDECTOMY;  Surgeon: Clyde Canterbury, MD;  Location: [facility] ORS;  Service: ENT;  Laterality: Bilateral;   TOTAL KNEE ARTHROPLASTY Left 09/29/2014   Procedure: TOTAL KNEE ARTHROPLASTY;  Surgeon: Leanor Kail,  MD;  Location: [facility] ORS;  Service: Orthopedics;  Laterality: Left;   TUBAL LIGATION       Family History: Family History  Problem Relation Age of Onset   Diabetes Mother    Hypertension Mother    Hyperlipidemia Mother    Lung cancer Sister    Colon cancer Brother    Bladder Cancer Neg Hx    Prostate cancer Neg Hx    Kidney cancer Neg Hx     Social History:  reports that she has never smoked. She has never used smokeless tobacco. She reports that she does not drink alcohol and does not use drugs.  Physical Exam: BP 136/77   Pulse 66   Ht 5\' 6"  (1.676 m)   Wt 271 lb (122.9 kg)   BMI 43.74 kg/m    Constitutional:  Alert and oriented, No acute distress. Cardiovascular: No clubbing, cyanosis, or edema. Respiratory: Normal respiratory effort, no increased work of breathing. GI: Abdomen is soft, nontender, nondistended, no abdominal masses  Laboratory Data: Reviewed, see HPI  Pertinent Imaging: I have personally viewed and interpreted the CT from 2019 that shows a normal-appearing bladder and kidneys with no hydronephrosis or stone disease.  Assessment & Plan:   70 year old female on diuretics with overactive bladder symptoms,normal urinalysis, normal PV, failed trial of oxybutynin.  We discussed that overactive bladder (OAB) is not a disease, but is a symptom complex that is generally not life-threatening.  Symptoms typically include urinary urgency, frequency, and urge incontinence.  There are numerous treatment options, however there are risks and benefits with both  medical and surgical management.  First-line treatment is behavioral therapies including bladder training, pelvic floor muscle training, and fluid management.  Second line treatments include oral antimuscarinics(Ditropan er, Trospium) and beta-3 agonist (Mybetriq). There is typically a period of medication trial (4-8 weeks) to find the optimal therapy and dosing. If symptoms are bothersome despite the above  management, third line options include intra-detrusor botox, peripheral tibial nerve stimulation (PTNS), and interstim (SNS). These are more invasive treatments with higher side effect profile, but may improve quality of life for patients with severe OAB symptoms.   -Trial of Myrbetriq 50 mg daily, samples given -Behavioral strategies discussed -RTC 1 month with PA symptom check, could consider Gemtesa or PTNS at that time if no improvement on the [facility]    Nickolas Madrid, MD 12/22/2020  [facility] [address] [place], [place] [address] [phone]

## 2021-01-16 IMAGING — CT CT NECK WITH CONTRAST
4 of 6 series · 14 of 33 positions shown, 16 images · IV contrast (omnipaque)
Comparison: Barium study 10/14/2018

CLINICAL DATA: Sensation something stuck in the throat over the
last 2 months. Esophageal displacement towards the left at barium
swallow.

EXAM:
CT NECK WITH CONTRAST
TECHNIQUE: Multidetector CT imaging of the neck was performed using the
standard protocol following the bolus administration of intravenous
contrast.
CONTRAST:  75mL OMNIPAQUE IOHEXOL 300 MG/ML  SOLN

[Series 5: axial bone neck · axial · 0.57mm/px · z∈[-687,-609]mm · 2 of 118 slices shown]
[im 40/118  bone]
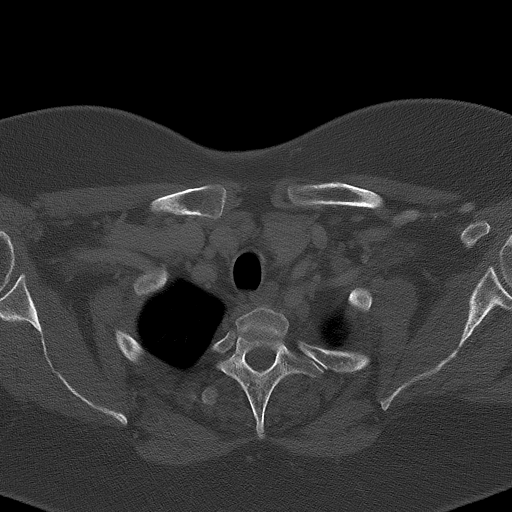
[im 79/118  bone]
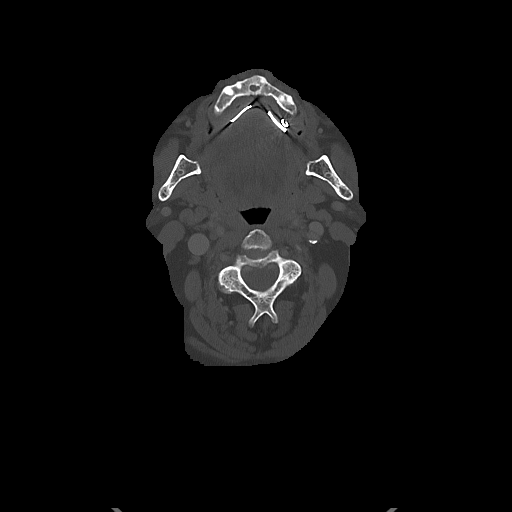

[Series 6: coronal neck · coronal · 0.48mm/px · 3 of 152 slices shown]
[im 56/152  bone]
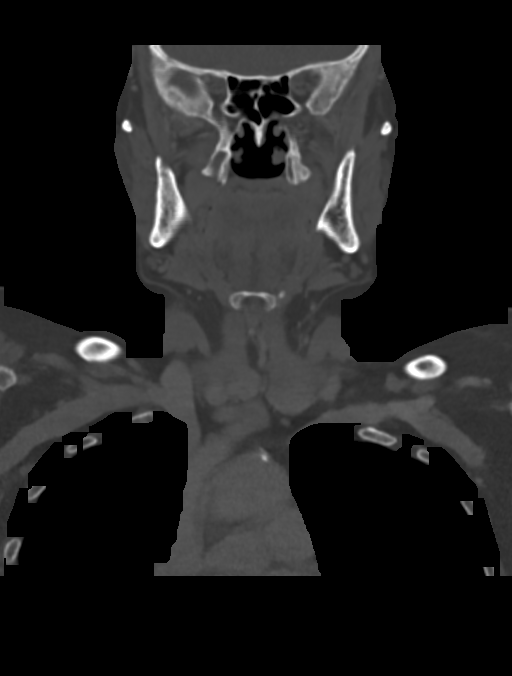
[im 69/152  bone]
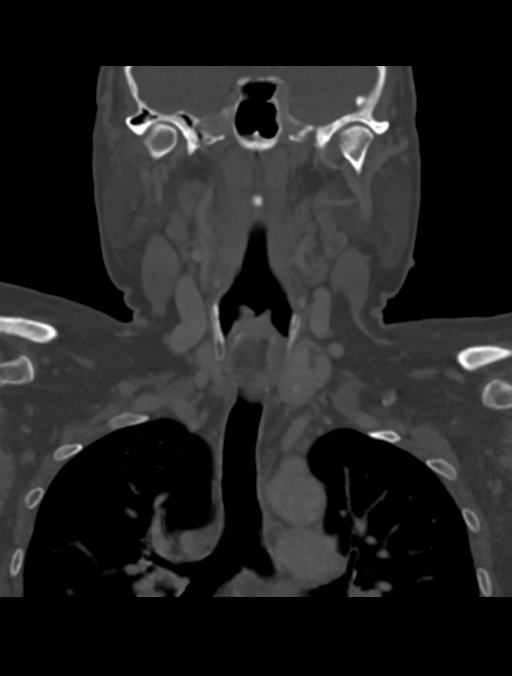
[im 83/152  bone]
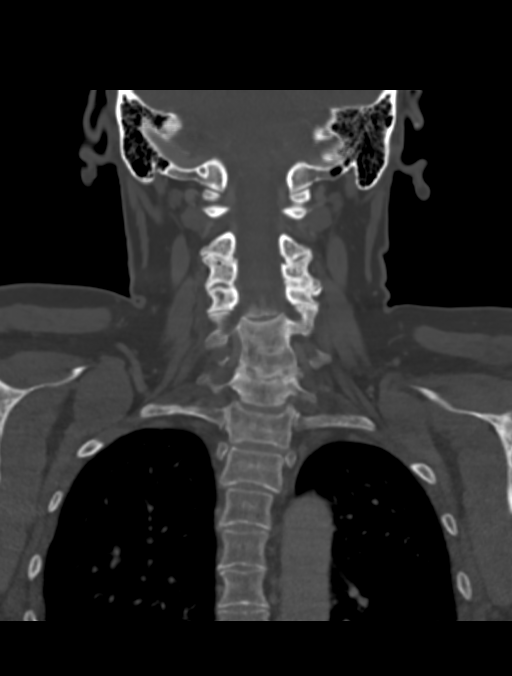

[Series 8: sagittal neck · sagittal · 0.60mm/px · 5 of 123 slices shown, 6 images]
[im 41/123  bone]
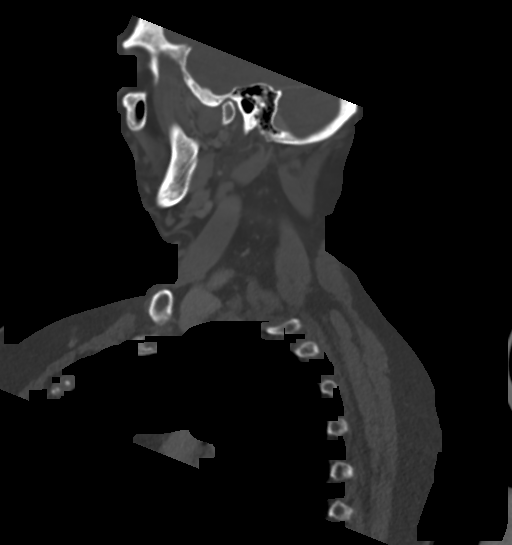
[im 51/123  bone]
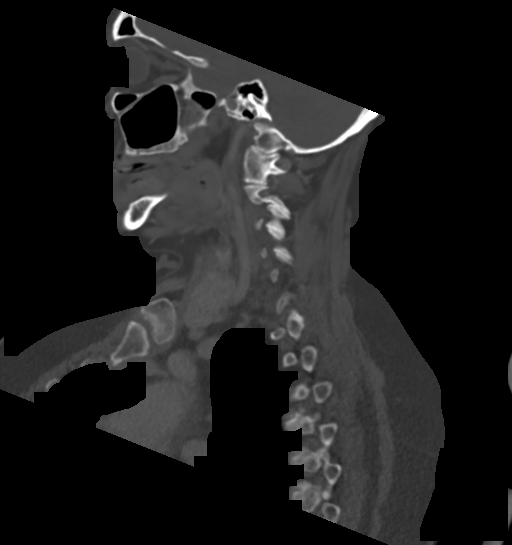
[im 62/123  soft-tissue]
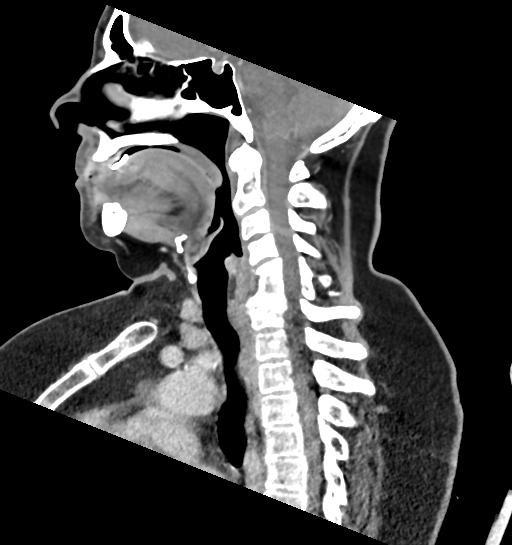
[im 62/123  bone]
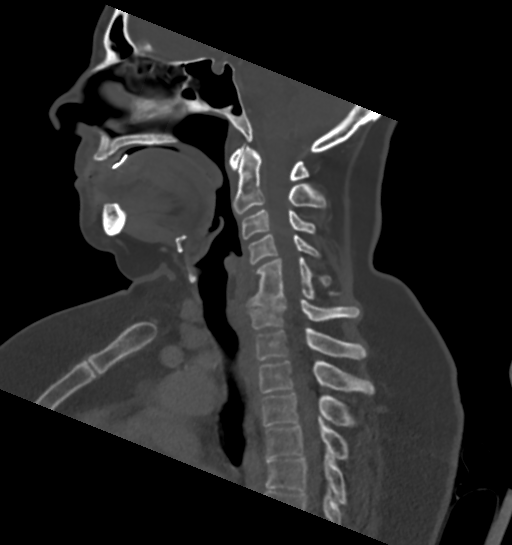
[im 72/123  bone]
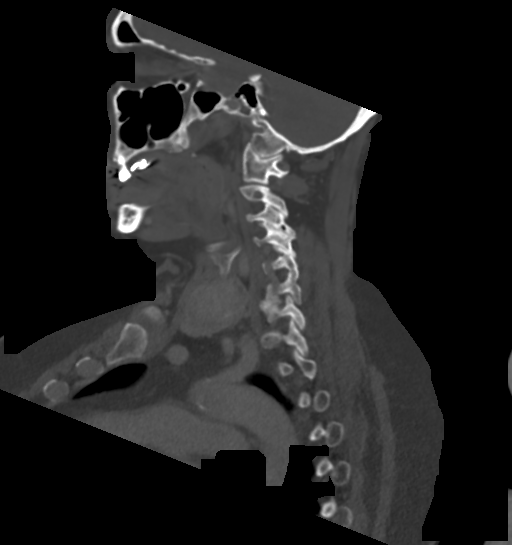
[im 82/123  bone]
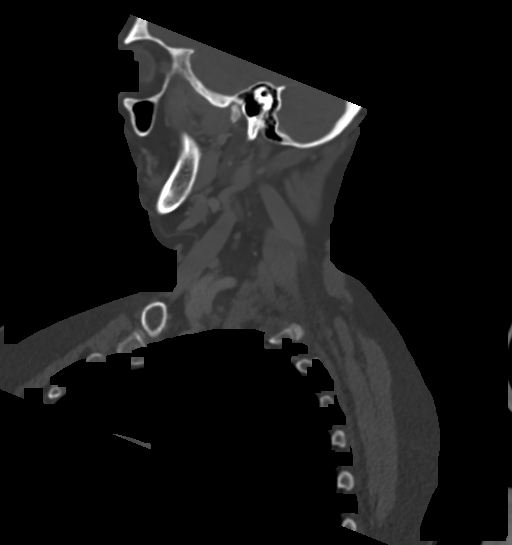

[Series 10: ax oropharynx neck · axial · 0.48mm/px · z∈[-791,-612]mm · 4 of 162 slices shown, 5 images]
[im 33/162  soft-tissue]
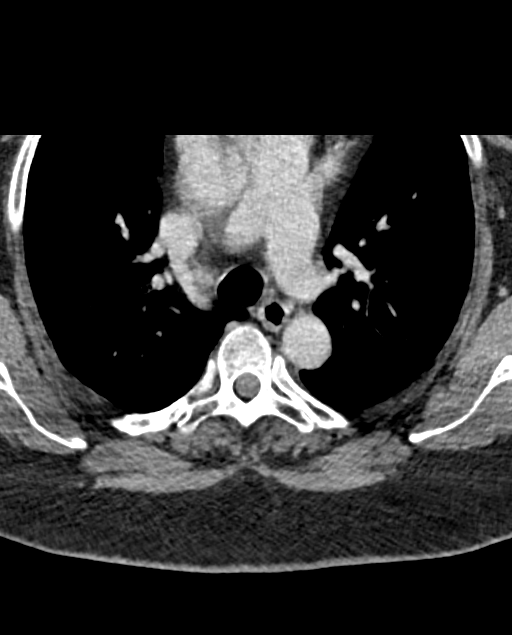
[im 33/162  bone]
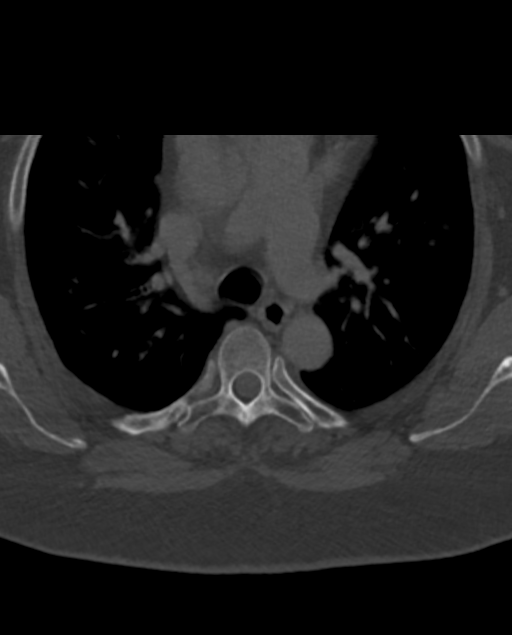
[im 65/162  bone]
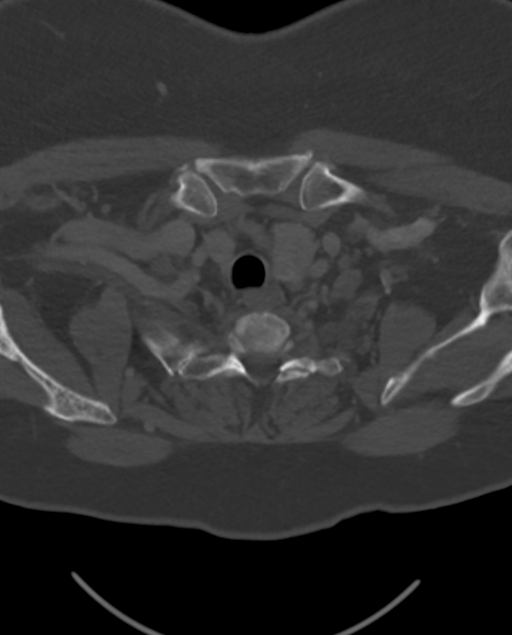
[im 97/162  bone]
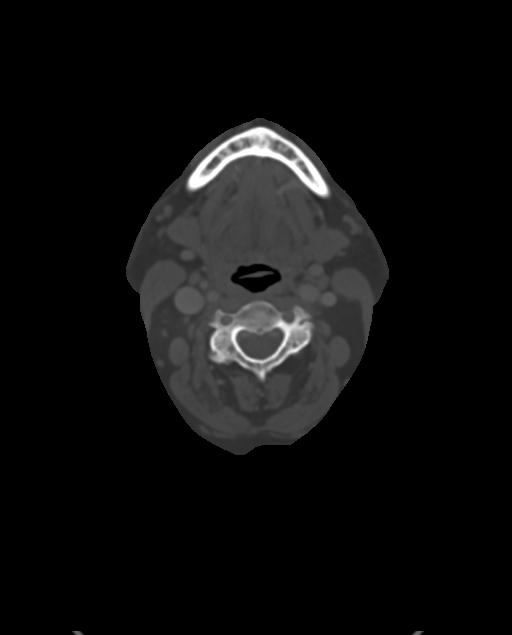
[im 129/162  bone]
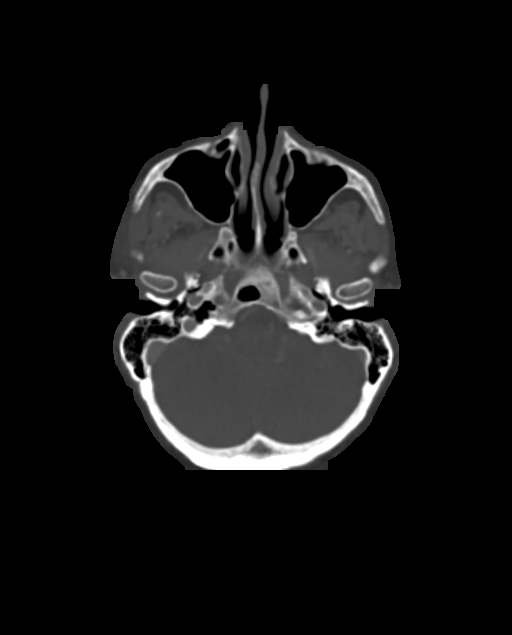

[14 of 33 positions shown; findings below may reference images not displayed]

FINDINGS: Pharynx and larynx: No mucosal or submucosal lesion. No sign of
foreign object.

Salivary glands: Parotid and submandibular glands are normal.

Thyroid: Thyroid gland is enlarged consistent with goiter. The left
lobe is larger than the right. The esophagus is not displaced by the
goiter.

Lymph nodes: No enlarged or low-density nodes on either side of the
neck.

Vascular: No significant vascular finding. Ordinary carotid
bifurcation atherosclerosis.

Limited intracranial: Normal

Visualized orbits: Normal

Mastoids and visualized paranasal sinuses: Clear

Skeleton: Congenital failure of segmentation at C5-6. Degenerative
spondylosis C6-7 with anterior osteophytes. These are not extremely
large and would not suspect that these are large enough to cause
dysphagia symptoms, though that is a consideration.

Upper chest: Normal

Other: None
IMPRESSION: No evidence of mucosal or submucosal lesion.

The patient does have a thyroid goiter with the left lobe being
larger than the right. This does not appear to have any compressive
effect or displacement affect upon the esophagus.

Congenital failure of segmentation at C5-6. Degenerative spondylosis
at C6-7 with anterior osteophytes. Though large anterior osteophytes
can cause dysphagia, these do not appear quite that large. This
remains a consideration however.

## 2021-01-20 ENCOUNTER — Ambulatory Visit (INDEPENDENT_AMBULATORY_CARE_PROVIDER_SITE_OTHER): Payer: Medicare HMO | Admitting: Physician Assistant

## 2021-01-20 ENCOUNTER — Other Ambulatory Visit: Payer: Self-pay

## 2021-01-20 ENCOUNTER — Encounter: Payer: Self-pay | Admitting: Physician Assistant

## 2021-01-20 VITALS — BP 148/78 | HR 66 | Ht 66.0 in | Wt 277.0 lb

## 2021-01-20 DIAGNOSIS — N3281 Overactive bladder: Secondary | ICD-10-CM

## 2021-01-20 LAB — BLADDER SCAN AMB NON-IMAGING

## 2021-01-20 MED ORDER — GEMTESA 75 MG PO TABS: 75.0000 mg | ORAL_TABLET | Freq: Every day | ORAL | 0 refills | Status: AC

## 2021-01-20 NOTE — Progress Notes (Signed)
01/20/2021 11:24 AM   Linda Rios 1950-10-01 [id number]  CC: Chief Complaint  Patient presents with   Over Active Bladder   HPI: Linda Rios is a 70 y.o. female on Lasix with PMH OAB wet who presents today for symptom recheck and PVR on Myrbetriq 50 mg daily.   Today she reports having taken Myrbetriq for 2 to 3 weeks with no notable symptomatic improvement.  She stopped this prematurely when she started to feel volume overloaded, which she attributed to the medication.  PVR 2 mL.  PMH: Past Medical History:  Diagnosis Date   Arthritis    Diverticulosis    GERD (gastroesophageal reflux disease)    Goiter    Right and left sides   Hyperlipidemia    Hyperplastic colon polyp    Hypertension    Internal hemorrhoids    Tubular adenoma of colon     Surgical History: Past Surgical History:  Procedure Laterality Date   APPENDECTOMY     CHOLECYSTECTOMY     COLONOSCOPY     COLONOSCOPY WITH PROPOFOL N/A 11/10/2018   Procedure: COLONOSCOPY WITH PROPOFOL;  Surgeon: Lollie Sails, MD;  Location: [facility] ENDOSCOPY;  Service: Endoscopy;  Laterality: N/A;   ESOPHAGOGASTRODUODENOSCOPY (EGD) WITH PROPOFOL N/A 11/10/2018   Procedure: ESOPHAGOGASTRODUODENOSCOPY (EGD) WITH PROPOFOL;  Surgeon: Lollie Sails, MD;  Location: [facility] ENDOSCOPY;  Service: Endoscopy;  Laterality: N/A;   ESOPHAGOGASTRODUODENOSCOPY (EGD) WITH PROPOFOL N/A 05/27/2019   Procedure: ESOPHAGOGASTRODUODENOSCOPY (EGD) WITH PROPOFOL;  Surgeon: Toledo, Benay Pike, MD;  Location: [facility] ENDOSCOPY;  Service: Gastroenterology;  Laterality: N/A;   FOOT SURGERY Right    KNEE ARTHROSCOPY Left    THYROIDECTOMY Bilateral 12/17/2018   Procedure: TOTAL THYROIDECTOMY;  Surgeon: Clyde Canterbury, MD;  Location: [facility] ORS;  Service: ENT;  Laterality: Bilateral;   TOTAL KNEE ARTHROPLASTY Left 09/29/2014   Procedure: TOTAL KNEE ARTHROPLASTY;  Surgeon: Leanor Kail, MD;  Location: [facility] ORS;  Service: Orthopedics;  Laterality:  Left;   TUBAL LIGATION      Home Medications:  Allergies as of 01/20/2021       Reactions   Pravastatin Other (See Comments)   Reaction:  Headaches    Vancomycin Hives, Itching   Patient with redness, hives, and itching to her scalp.        Medication List        Accurate as of January 20, 2021 11:24 AM. If you have any questions, ask your nurse or doctor.          STOP taking these medications    mirabegron ER 50 MG Tb24 tablet Commonly known as: MYRBETRIQ Stopped by: Debroah Loop, PA-C       TAKE these medications    Acetaminophen 500 MG capsule Take by mouth.   alendronate 70 MG tablet Commonly known as: FOSAMAX   amLODipine 5 MG tablet Commonly known as: NORVASC Take 5 mg by mouth daily.   bacitracin ointment Apply 1 application topically every 8 (eight) hours.   Calcium Carb-Cholecalciferol 600-800 MG-UNIT Tabs Take 1 tablet by mouth 2 (two) times daily.   celecoxib 100 MG capsule Commonly known as: CELEBREX   Citrucel oral powder Generic drug: methylcellulose Take 1 packet by mouth daily.   docusate sodium 100 MG capsule Commonly known as: COLACE Take 1 capsule (100 mg total) by mouth 2 (two) times daily.   furosemide 40 MG tablet Commonly known as: LASIX   Gemtesa 75 MG Tabs Generic drug: Vibegron Take 75 mg by mouth daily. Started by:  Krina Mraz, PA-C   GLUCOSAMINE CHONDR 1500 COMPLX PO Take 1 tablet by mouth 2 (two) times daily.   hydrochlorothiazide 12.5 MG tablet Commonly known as: HYDRODIURIL Take 12.5 mg by mouth daily.   levothyroxine 125 MCG tablet Commonly known as: SYNTHROID Take 1 tablet (125 mcg total) by mouth daily at 6 (six) AM.   lovastatin 20 MG tablet Commonly known as: MEVACOR Take 20 mg by mouth at bedtime.   omeprazole 40 MG capsule Commonly known as: PRILOSEC Take 40 mg by mouth daily.   One Daily For Women Tabs Take 1 tablet by mouth daily.   Pfizer COVID-19 Vac Bivalent  injection Generic drug: COVID-19 mRNA bivalent vaccine (Pfizer)   rosuvastatin 20 MG tablet Commonly known as: CRESTOR   tiZANidine 4 MG tablet Commonly known as: ZANAFLEX       Allergies:  Allergies  Allergen Reactions   Pravastatin Other (See Comments)    Reaction:  Headaches    Vancomycin Hives and Itching    Patient with redness, hives, and itching to her scalp.    Family History: Family History  Problem Relation Age of Onset   Diabetes Mother    Hypertension Mother    Hyperlipidemia Mother    Lung cancer Sister    Colon cancer Brother    Bladder Cancer Neg Hx    Prostate cancer Neg Hx    Kidney cancer Neg Hx    Social History:   reports that she has never smoked. She has never used smokeless tobacco. She reports that she does not drink alcohol and does not use drugs.  Physical Exam: BP (!) 148/78   Pulse 66   Ht 5\' 6"  (1.676 m)   Wt 277 lb (125.6 kg)   BMI 44.71 kg/m   Constitutional:  Alert and oriented, no acute distress, nontoxic appearing HEENT: [place], AT Cardiovascular: No clubbing, cyanosis, or edema Respiratory: Normal respiratory effort, no increased work of breathing Skin: No rashes, bruises or suspicious lesions Neurologic: Grossly intact, no focal deficits, moving all 4 extremities Psychiatric: Normal mood and affect  Laboratory Data: Results for orders placed or performed in visit on 01/20/21  Bladder Scan (Post Void Residual) in office  Result Value Ref Range   Scan Result 70mL    Assessment & Plan:   1. OAB (overactive bladder) Patient stopped Myrbetriq due to concerns for volume overload, however given that she is on Lasix, I think this was coincidental and not a true medication side effect.  Despite this, she did take Myrbetriq continuously for 2 to 3 weeks with no notable symptomatic improvement, so I see no clinical value in restarting it at this time.  I offered her a trial of Gemtesa versus PTNS and she wishes to try Gemtesa first.  I  only had 2 more weeks of samples of Gemtesa in the office, so we will plan for symptom recheck and PVR after completion of these.  If medication is not helpful or proves cost prohibitive, recommend pursuing PTNS.  Patient is in agreement with this plan. - Bladder Scan (Post Void Residual) in office - Vibegron (GEMTESA) 75 MG TABS; Take 75 mg by mouth daily.  Dispense: 14 tablet; Refill: 0  Return in about 2 weeks (around 02/03/2021) for Symptom recheck with PVR.  Debroah Loop, PA-C  [facility] [address] [place], [place] [address] [phone]

## 2021-02-06 ENCOUNTER — Ambulatory Visit: Payer: Medicare HMO | Admitting: Physician Assistant

## 2021-02-07 DIAGNOSIS — M1711 Unilateral primary osteoarthritis, right knee: Secondary | ICD-10-CM | POA: Diagnosis not present

## 2021-02-07 DIAGNOSIS — M21061 Valgus deformity, not elsewhere classified, right knee: Secondary | ICD-10-CM | POA: Diagnosis not present

## 2021-02-09 DIAGNOSIS — Z6841 Body Mass Index (BMI) 40.0 and over, adult: Secondary | ICD-10-CM | POA: Diagnosis not present

## 2021-02-09 DIAGNOSIS — M1711 Unilateral primary osteoarthritis, right knee: Secondary | ICD-10-CM | POA: Diagnosis not present

## 2021-02-09 DIAGNOSIS — I10 Essential (primary) hypertension: Secondary | ICD-10-CM | POA: Diagnosis not present
